# Patient Record
Sex: Male | Born: 1935 | Race: White | Hispanic: No | Marital: Married | Smoking: Never smoker
Health system: Southern US, Community
[De-identification: ages and names within clinical notes are randomized; demographics above are authoritative.]

## PROBLEM LIST (undated history)

## (undated) DIAGNOSIS — M542 Cervicalgia: Secondary | ICD-10-CM

## (undated) DIAGNOSIS — R269 Unspecified abnormalities of gait and mobility: Secondary | ICD-10-CM

## (undated) DIAGNOSIS — G629 Polyneuropathy, unspecified: Secondary | ICD-10-CM

## (undated) DIAGNOSIS — N400 Enlarged prostate without lower urinary tract symptoms: Secondary | ICD-10-CM

## (undated) DIAGNOSIS — G609 Hereditary and idiopathic neuropathy, unspecified: Secondary | ICD-10-CM

## (undated) DIAGNOSIS — M4800 Spinal stenosis, site unspecified: Secondary | ICD-10-CM

## (undated) DIAGNOSIS — M21372 Foot drop, left foot: Secondary | ICD-10-CM

## (undated) DIAGNOSIS — M21371 Foot drop, right foot: Secondary | ICD-10-CM

## (undated) DIAGNOSIS — E785 Hyperlipidemia, unspecified: Secondary | ICD-10-CM

## (undated) DIAGNOSIS — I10 Essential (primary) hypertension: Secondary | ICD-10-CM

## (undated) DIAGNOSIS — C61 Malignant neoplasm of prostate: Secondary | ICD-10-CM

## (undated) DIAGNOSIS — R251 Tremor, unspecified: Secondary | ICD-10-CM

## (undated) DIAGNOSIS — R339 Retention of urine, unspecified: Secondary | ICD-10-CM

## (undated) DIAGNOSIS — M5416 Radiculopathy, lumbar region: Secondary | ICD-10-CM

## (undated) HISTORY — PX: CERVICAL SPINE SURGERY: SHX589

## (undated) HISTORY — PX: KNEE ARTHROSCOPY: SHX127

## (undated) HISTORY — DX: Foot drop, right foot: M21.371

## (undated) HISTORY — DX: Unspecified abnormalities of gait and mobility: R26.9

## (undated) HISTORY — DX: Radiculopathy, lumbar region: M54.16

## (undated) HISTORY — PX: VASECTOMY: SHX75

## (undated) HISTORY — PX: NASAL SINUS SURGERY: SHX719

## (undated) HISTORY — PX: VASOTOMY: SUR1432

## (undated) HISTORY — DX: Hereditary and idiopathic neuropathy, unspecified: G60.9

## (undated) HISTORY — DX: Foot drop, left foot: M21.372

## (undated) HISTORY — DX: Tremor, unspecified: R25.1

---

## 2013-01-25 ENCOUNTER — Emergency Department (HOSPITAL_BASED_OUTPATIENT_CLINIC_OR_DEPARTMENT_OTHER): Payer: Medicare Other

## 2013-01-25 ENCOUNTER — Encounter (HOSPITAL_BASED_OUTPATIENT_CLINIC_OR_DEPARTMENT_OTHER): Payer: Self-pay | Admitting: Emergency Medicine

## 2013-01-25 ENCOUNTER — Emergency Department (HOSPITAL_BASED_OUTPATIENT_CLINIC_OR_DEPARTMENT_OTHER)
Admission: EM | Admit: 2013-01-25 | Discharge: 2013-01-26 | Disposition: A | Discharge status: Home / Self Care | Payer: Medicare Other | Attending: Emergency Medicine | Admitting: Emergency Medicine

## 2013-01-25 DIAGNOSIS — Z862 Personal history of diseases of the blood and blood-forming organs and certain disorders involving the immune mechanism: Secondary | ICD-10-CM | POA: Insufficient documentation

## 2013-01-25 DIAGNOSIS — F411 Generalized anxiety disorder: Secondary | ICD-10-CM | POA: Insufficient documentation

## 2013-01-25 DIAGNOSIS — Z87448 Personal history of other diseases of urinary system: Secondary | ICD-10-CM | POA: Insufficient documentation

## 2013-01-25 DIAGNOSIS — I10 Essential (primary) hypertension: Secondary | ICD-10-CM | POA: Insufficient documentation

## 2013-01-25 DIAGNOSIS — Z8639 Personal history of other endocrine, nutritional and metabolic disease: Secondary | ICD-10-CM | POA: Insufficient documentation

## 2013-01-25 DIAGNOSIS — F419 Anxiety disorder, unspecified: Secondary | ICD-10-CM

## 2013-01-25 DIAGNOSIS — R61 Generalized hyperhidrosis: Secondary | ICD-10-CM | POA: Insufficient documentation

## 2013-01-25 DIAGNOSIS — K219 Gastro-esophageal reflux disease without esophagitis: Secondary | ICD-10-CM | POA: Insufficient documentation

## 2013-01-25 DIAGNOSIS — K5900 Constipation, unspecified: Secondary | ICD-10-CM | POA: Insufficient documentation

## 2013-01-25 HISTORY — DX: Benign prostatic hyperplasia without lower urinary tract symptoms: N40.0

## 2013-01-25 HISTORY — DX: Retention of urine, unspecified: R33.9

## 2013-01-25 HISTORY — DX: Cervicalgia: M54.2

## 2013-01-25 HISTORY — DX: Hyperlipidemia, unspecified: E78.5

## 2013-01-25 HISTORY — DX: Essential (primary) hypertension: I10

## 2013-01-25 LAB — CBC WITH DIFFERENTIAL/PLATELET
Basophils Absolute: 0 10*3/uL (ref 0.0–0.1)
Basophils Relative: 0 % (ref 0–1)
Eosinophils Absolute: 0.1 10*3/uL (ref 0.0–0.7)
Eosinophils Relative: 1 % (ref 0–5)
Hemoglobin: 13.5 g/dL (ref 13.0–17.0)
Lymphocytes Relative: 16 % (ref 12–46)
MCHC: 33.3 g/dL (ref 30.0–36.0)
MCV: 87.7 fL (ref 78.0–100.0)
Monocytes Relative: 14 % — ABNORMAL HIGH (ref 3–12)
Neutro Abs: 4.5 10*3/uL (ref 1.7–7.7)
Platelets: 204 10*3/uL (ref 150–400)
RDW: 13.7 % (ref 11.5–15.5)
WBC: 6.5 10*3/uL (ref 4.0–10.5)

## 2013-01-25 MED ORDER — LORAZEPAM 1 MG PO TABS
0.5000 mg | ORAL_TABLET | Freq: Once | ORAL | Status: AC
  Administered 2013-01-25: 0.5 mg via ORAL
  Filled 2013-01-25: qty 1

## 2013-01-25 MED ORDER — GI COCKTAIL ~~LOC~~
30.0000 mL | Freq: Once | ORAL | Status: AC
  Administered 2013-01-25: 30 mL via ORAL
  Filled 2013-01-25: qty 30

## 2013-01-25 NOTE — ED Provider Notes (Signed)
CSN: [id number]     Arrival date & time 01/25/13  2123 History  This chart was scribed for Ken Bonn Smitty Cords, MD by Ronal Fear, ED Scribe. This patient was seen in room MH08/MH08 and the patient's care was started at 11:02 PM.     Chief Complaint  Patient presents with  . Anxiety    lightheadness   Patient is a 77 y.o. male presenting with anxiety. The history is provided by the patient. No language interpreter was used.  Anxiety This is a new problem. The current episode started more than 2 days ago. The problem occurs constantly. The problem has not changed since onset.Pertinent negatives include no chest pain, no headaches and no shortness of breath. Nothing aggravates the symptoms. Nothing relieves the symptoms. He has tried nothing for the symptoms. The treatment provided no relief.    HPI Comments: Jaxtin Raimondo is a 77 y.o. male who presents to the Emergency Department complaining of abdominal pain below the umbilicus with associated constipation, diaphoresis and anxiety. 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 Pt denies CP and SOB, fever, cough or sore throat.  No weakness nor numbness.  Has taken the meds on an empty stomach.      Past Medical History  Diagnosis Date  . Hypertension   . Hyperlipidemia   . Urinary retention   . Prostate enlargement   . Neck pain    Past Surgical History  Procedure Laterality Date  . Knee arthroscopy    . Vasotomy     History reviewed. No pertinent family history. History  Substance Use Topics  . Smoking status: Never Smoker   . Smokeless tobacco: Not on file  . Alcohol Use: Yes    Review of Systems  Constitutional: Negative for fever.  HENT: Negative for neck stiffness.   Eyes: Negative for photophobia.  Respiratory: Negative for cough, chest tightness, shortness  of breath and wheezing.   Cardiovascular: Negative for chest pain, palpitations and leg swelling.  Gastrointestinal: Negative for nausea and vomiting.  Skin: Negative for rash.  Neurological: Negative for headaches.  Psychiatric/Behavioral: Negative for suicidal ideas and hallucinations. The patient is nervous/anxious.   All other systems reviewed and are negative.    Allergies  Review of patient's allergies indicates no known allergies.  Home Medications  No current outpatient prescriptions on file. BP 157/90  Pulse 62  Temp(Src) 98.4 F (36.9 C)  Ht 5\' 10"  (1.778 m)  Wt 241 lb (109.317 kg)  BMI 34.58 kg/m2  SpO2 98% Physical Exam  Nursing note and vitals reviewed. Constitutional: He is oriented to person, place, and time. He appears well-developed and well-nourished. No distress.  HENT:  Head: Normocephalic and atraumatic.  Mouth/Throat: Oropharynx is clear and moist. No oropharyngeal exudate.  Eyes: EOM are normal. Pupils are equal, round, and reactive to light.  Neck: Normal range of motion. Neck supple. No tracheal deviation present.  Cardiovascular: Normal rate, regular rhythm, normal heart sounds and intact distal pulses.   Pulmonary/Chest: Effort normal and breath sounds normal. No respiratory distress. He has no wheezes. He has no rales.  Abdominal: Soft. Bowel sounds are normal. He exhibits no distension. There is no tenderness. There is no rebound and no guarding.  Moving hiatal hernia. Hyper active bowel sounds  Musculoskeletal: Normal range of motion.  Lymphadenopathy:  He has no cervical adenopathy.  Neurological: He is alert and oriented to person, place, and time.  Skin: Skin is warm and dry.  Psychiatric: His mood appears anxious.    ED Course  Procedures (including critical care time)  DIAGNOSTIC STUDIES: Oxygen Saturation is 98% on RA, normal by my interpretation.    COORDINATION OF CARE: 11:15 PM- Pt advised of plan for treatment including blood  work  And X-ray and pt agrees.      Labs Review Labs Reviewed - No data to display Imaging Review No results found.  MDM  No diagnosis found.  Date: 01/26/2013  Rate: 68  Rhythm: normal sinus rhythm  QRS Axis: normal  Intervals: normal  ST/T Wave abnormalities: normal  Conduction Disutrbances: none  Narrative Interpretation: unremarkable  Anxiety and medication side effects along with constipation.   Given ongoing symptoms of > 8 hours a negative EKG and troponin excluded ACS.  No signs of ulcer perforation.  Recommend d/c of celebrex and will treat.  Follow up immediately with your PMD.  Patient feels improved and verbalizes understanding and agrees to follow up   I personally performed the services described in this documentation, which was scribed in my presence. The recorded information has been reviewed and is accurate.    Jasmine Awe, MD 01/26/13 [phone]

## 2013-01-25 NOTE — ED Notes (Addendum)
Pt reports feeling extremely nervous anxious and lightheaded recent stressors at work and in planing to get married soon EKG completed NSR 70's taken to EDP pt states recently started taking Cellabrex for neck pain

## 2013-01-25 NOTE — ED Notes (Addendum)
Denies any febrile illness, pain, chest pain.  Only c/o headache amd left lateral neck pain, chronic in nature.  Pt also reports having some episodes of diaphoresis/dizziness for the past few days intermittently.

## 2013-01-26 LAB — COMPREHENSIVE METABOLIC PANEL
ALT: 18 U/L (ref 0–53)
AST: 20 U/L (ref 0–37)
Albumin: 3.9 g/dL (ref 3.5–5.2)
CO2: 25 mEq/L (ref 19–32)
Calcium: 9.3 mg/dL (ref 8.4–10.5)
GFR calc non Af Amer: 82 mL/min — ABNORMAL LOW (ref >90)
Sodium: 138 mEq/L (ref 135–145)
Total Protein: 6.9 g/dL (ref 6.0–8.3)

## 2013-01-26 MED ORDER — LORAZEPAM 1 MG PO TABS: 0.5000 mg | ORAL_TABLET | Freq: Three times a day (TID) | ORAL | Status: DC | PRN

## 2013-01-26 MED ORDER — POLYETHYLENE GLYCOL 3350 17 GM/SCOOP PO POWD: 17.0000 g | Freq: Every day | ORAL | Status: DC

## 2013-01-26 MED ORDER — OMEPRAZOLE 20 MG PO CPDR: 20.0000 mg | DELAYED_RELEASE_CAPSULE | Freq: Every day | ORAL | Status: AC

## 2013-01-26 NOTE — ED Notes (Signed)
Pt resting quietly.  Family at bedside.  Visibly appears to feel better ,more relaxed at present.

## 2014-01-13 ENCOUNTER — Emergency Department (HOSPITAL_BASED_OUTPATIENT_CLINIC_OR_DEPARTMENT_OTHER)
Admission: EM | Admit: 2014-01-13 | Discharge: 2014-01-13 | Disposition: A | Discharge status: Home / Self Care | Payer: Medicare Other | Attending: Emergency Medicine | Admitting: Emergency Medicine

## 2014-01-13 ENCOUNTER — Other Ambulatory Visit: Payer: Self-pay | Admitting: Orthopaedic Surgery

## 2014-01-13 ENCOUNTER — Encounter (HOSPITAL_BASED_OUTPATIENT_CLINIC_OR_DEPARTMENT_OTHER): Payer: Self-pay | Admitting: Emergency Medicine

## 2014-01-13 DIAGNOSIS — I10 Essential (primary) hypertension: Secondary | ICD-10-CM | POA: Insufficient documentation

## 2014-01-13 DIAGNOSIS — Z8546 Personal history of malignant neoplasm of prostate: Secondary | ICD-10-CM | POA: Diagnosis not present

## 2014-01-13 DIAGNOSIS — M545 Low back pain, unspecified: Secondary | ICD-10-CM | POA: Insufficient documentation

## 2014-01-13 DIAGNOSIS — Z791 Long term (current) use of non-steroidal anti-inflammatories (NSAID): Secondary | ICD-10-CM | POA: Diagnosis not present

## 2014-01-13 DIAGNOSIS — R29898 Other symptoms and signs involving the musculoskeletal system: Secondary | ICD-10-CM

## 2014-01-13 DIAGNOSIS — M5442 Lumbago with sciatica, left side: Secondary | ICD-10-CM

## 2014-01-13 DIAGNOSIS — M6281 Muscle weakness (generalized): Secondary | ICD-10-CM | POA: Insufficient documentation

## 2014-01-13 DIAGNOSIS — Z87448 Personal history of other diseases of urinary system: Secondary | ICD-10-CM | POA: Diagnosis not present

## 2014-01-13 DIAGNOSIS — IMO0002 Reserved for concepts with insufficient information to code with codable children: Secondary | ICD-10-CM | POA: Insufficient documentation

## 2014-01-13 DIAGNOSIS — Z8669 Personal history of other diseases of the nervous system and sense organs: Secondary | ICD-10-CM | POA: Insufficient documentation

## 2014-01-13 DIAGNOSIS — E785 Hyperlipidemia, unspecified: Secondary | ICD-10-CM | POA: Insufficient documentation

## 2014-01-13 DIAGNOSIS — Z7982 Long term (current) use of aspirin: Secondary | ICD-10-CM | POA: Insufficient documentation

## 2014-01-13 DIAGNOSIS — Z79899 Other long term (current) drug therapy: Secondary | ICD-10-CM | POA: Insufficient documentation

## 2014-01-13 DIAGNOSIS — Z9889 Other specified postprocedural states: Secondary | ICD-10-CM | POA: Diagnosis not present

## 2014-01-13 HISTORY — DX: Malignant neoplasm of prostate: C61

## 2014-01-13 HISTORY — DX: Spinal stenosis, site unspecified: M48.00

## 2014-01-13 HISTORY — DX: Polyneuropathy, unspecified: G62.9

## 2014-01-13 MED ORDER — HYDROCODONE-ACETAMINOPHEN 5-325 MG PO TABS: 1.0000 | ORAL_TABLET | ORAL | Status: DC | PRN

## 2014-01-13 MED ORDER — HYDROCODONE-ACETAMINOPHEN 5-325 MG PO TABS
2.0000 | ORAL_TABLET | Freq: Once | ORAL | Status: AC
  Administered 2014-01-13: 2 via ORAL
  Filled 2014-01-13: qty 2

## 2014-01-13 NOTE — ED Provider Notes (Signed)
CSN: [id number]     Arrival date & time 01/13/14  1121 History   First MD Initiated Contact with Patient 01/13/14 1204     Chief Complaint  Patient presents with  . Back Pain     (Consider location/radiation/quality/duration/timing/severity/associated sxs/prior Treatment) HPI Comments: Patient is a 78 year old male with history of hypertension, peripheral neuropathy. He presents with complaints of pain in his lower back for the past 9 days. He states he lifted a heavy table and helped to carry it up the stairs. His pain began shortly thereafter. He is now having weakness in his left leg and is having difficulty ambulating. He was seen by neurosurgery and prescribed prednisone however is not improving. He denies any bowel or bladder complaints.  Patient is a 78 y.o. male presenting with back pain. The history is provided by the patient.  Back Pain Location:  Lumbar spine Quality:  Stabbing Radiates to:  R thigh Pain severity:  Moderate Pain is:  Same all the time Onset quality:  Sudden Duration:  9 days Timing:  Constant Progression:  Worsening Chronicity:  New Context: lifting heavy objects     Past Medical History  Diagnosis Date  . Hypertension   . Hyperlipidemia   . Urinary retention   . Prostate enlargement   . Neck pain   . Prostate cancer     currently has seed implant  . Spinal stenosis   . Peripheral neuropathy     bilateral feet   Past Surgical History  Procedure Laterality Date  . Knee arthroscopy    . Vasotomy    . Nasal sinus surgery    . Vasectomy     No family history on file. History  Substance Use Topics  . Smoking status: Never Smoker   . Smokeless tobacco: Not on file  . Alcohol Use: Yes     Comment: occassionally    Review of Systems  Musculoskeletal: Positive for back pain.  All other systems reviewed and are negative.     Allergies  Review of patient's allergies indicates no known allergies.  Home Medications   Prior to Admission  medications   Medication Sig Start Date End Date Taking? Authorizing Provider  Ascorbic Acid (VITAMIN C) 1000 MG tablet Take 1,000 mg by mouth daily.   Yes Historical Provider, MD  aspirin 81 MG tablet Take 81 mg by mouth daily.   Yes Historical Provider, MD  Azelaic Acid (FINACEA) 15 % cream Apply topically 2 (two) times daily. After skin is thoroughly washed and patted dry, gently but thoroughly massage a thin film of azelaic acid cream into the affected area twice daily, in the morning and evening.   Yes Historical Provider, MD  baclofen (LIORESAL) 10 MG tablet Take 10 mg by mouth 3 (three) times daily.   Yes Historical Provider, MD  clonazePAM (KLONOPIN) 1 MG tablet Take 1 mg by mouth 2 (two) times daily as needed for anxiety.   Yes Historical Provider, MD  Cyanocobalamin (VITAMIN B 12 PO) Take 1,000 mg by mouth.   Yes Historical Provider, MD  diphenhydramine-acetaminophen (TYLENOL PM) 25-500 MG TABS Take 2 tablets by mouth at bedtime as needed.   Yes Historical Provider, MD  enalapril (VASOTEC) 20 MG tablet Take 20 mg by mouth daily.   Yes Historical Provider, MD  finasteride (PROSCAR) 5 MG tablet Take 5 mg by mouth daily.   Yes Historical Provider, MD  fluticasone (FLONASE) 50 MCG/ACT nasal spray Place into both nostrils daily.   Yes Historical Provider,  MD  meloxicam (MOBIC) 7.5 MG tablet Take 7.5 mg by mouth daily.   Yes Historical Provider, MD  Multiple Vitamin (MULTIVITAMIN) capsule Take 1 capsule by mouth daily.   Yes Historical Provider, MD  Omega-3 Fatty Acids (FISH OIL PO) Take by mouth.   Yes Historical Provider, MD  primidone (MYSOLINE) 250 MG tablet Take 250 mg by mouth 4 (four) times daily.   Yes Historical Provider, MD  sildenafil (VIAGRA) 100 MG tablet Take 100 mg by mouth daily as needed for erectile dysfunction.   Yes Historical Provider, MD  simvastatin (ZOCOR) 20 MG tablet Take 20 mg by mouth daily.   Yes Historical Provider, MD  tamsulosin (FLOMAX) 0.4 MG CAPS capsule Take  0.4 mg by mouth.   Yes Historical Provider, MD  traMADol (ULTRAM-ER) 100 MG 24 hr tablet Take 100 mg by mouth daily.   Yes Historical Provider, MD  LORazepam (ATIVAN) 1 MG tablet Take 0.5 tablets (0.5 mg total) by mouth every 8 (eight) hours as needed for anxiety. 01/26/13   April K Palumbo-Rasch, MD  omeprazole (PRILOSEC) 20 MG capsule Take 1 capsule (20 mg total) by mouth daily. 01/26/13   April K Palumbo-Rasch, MD  polyethylene glycol powder (MIRALAX) powder Take 17 g by mouth daily. 01/26/13   April K Palumbo-Rasch, MD   BP 104/81  Pulse 70  Temp(Src) 97.5 F (36.4 C) (Oral)  Resp 16  Ht 5\' 9"  (1.753 m)  Wt 242 lb (109.77 kg)  BMI 35.72 kg/m2  SpO2 100% Physical Exam  Nursing note and vitals reviewed. Constitutional: He is oriented to person, place, and time. He appears well-developed and well-nourished. No distress.  HENT:  Head: Normocephalic and atraumatic.  Neck: Normal range of motion. Neck supple.  Cardiovascular: Normal rate, regular rhythm and normal heart sounds.   No murmur heard. Pulmonary/Chest: Effort normal and breath sounds normal. No respiratory distress. He has no wheezes.  Abdominal: Soft. Bowel sounds are normal. He exhibits no distension. There is no tenderness.  Musculoskeletal: Normal range of motion. He exhibits no edema.  There is tenderness to palpation in the soft tissues of the right lumbar paraspinal musculature. There is no bony tenderness.  Neurological: He is alert and oriented to person, place, and time.  Strength is 5 out of 5 in the bilateral lower extremities. DTRs are trace and symmetrical. He is able to stand, however when he ambulates his left leg does not support his weight.  Skin: Skin is warm and dry. He is not diaphoretic.    ED Course  Procedures (including critical care time) Labs Review Labs Reviewed - No data to display  Imaging Review No results found.   EKG Interpretation None      MDM   Final diagnoses:  None     Patient is a 78 year old male who presents with a one-week history of low back pain. He is now having weakness in his left leg and is having difficulty walking. He was seen by his neurosurgeon several days ago and prescribed prednisone however is not getting better. On exam he has difficulty ambulating and holding his own weight due to weakness in his left leg. I feel as though he requires an MRI to further evaluate this condition. While the family was in the exam room, they did hear from their neurosurgeon who has made arrangements for an MRI today at 4:30. The patient prefers to have the test done that was scheduled by his neurosurgeon as an outpatient as opposed to being transferred  to [facility] as I had offered. I will provide pain medication which he can take in the meantime.    Veryl Speak, MD 01/13/14 [phone]

## 2014-01-13 NOTE — ED Notes (Signed)
Patient states he lifted a table and carried it down step nine days ago.  States the next day he had pain in his lower back, and has since developed numbness down his left leg.  Was seen by his PCP seven days ago and given a dose pack of steroids.  States he continues to have pain and weakness in his left leg, with pain in the left knee.  Fell yesterday due to his left leg giving out and now has pain in his left shoulder.  History of spinal stenosis, which is associated with peripheral numbness in his feet.

## 2014-01-13 NOTE — Discharge Instructions (Signed)
Back Pain, Adult °Back pain is very common. The pain often gets better over time. The cause of back pain is usually not dangerous. Most people can learn to manage their back pain on their own.  °HOME CARE  °· Stay active. Start with short walks on flat ground if you can. Try to walk farther each day. °· Do not sit, drive, or stand in one place for more than 30 minutes. Do not stay in bed. °· Do not avoid exercise or work. Activity can help your back heal faster. °· Be careful when you bend or lift an object. Bend at your knees, keep the object close to you, and do not twist. °· Sleep on a firm mattress. Lie on your side, and bend your knees. If you lie on your back, put a pillow under your knees. °· Only take medicines as told by your doctor. °· Put ice on the injured area. °¨ Put ice in a plastic bag. °¨ Place a towel between your skin and the bag. °¨ Leave the ice on for 15-20 minutes, 03-04 times a day for the first 2 to 3 days. After that, you can switch between ice and heat packs. °· Ask your doctor about back exercises or massage. °· Avoid feeling anxious or stressed. Find good ways to deal with stress, such as exercise. °GET HELP RIGHT AWAY IF:  °· Your pain does not go away with rest or medicine. °· Your pain does not go away in 1 week. °· You have new problems. °· You do not feel well. °· The pain spreads into your legs. °· You cannot control when you poop (bowel movement) or pee (urinate). °· Your arms or legs feel weak or lose feeling (numbness). °· You feel sick to your stomach (nauseous) or throw up (vomit). °· You have belly (abdominal) pain. °· You feel like you may pass out (faint). °MAKE SURE YOU:  °· Understand these instructions. °· Will watch your condition. °· Will get help right away if you are not doing well or get worse. °Document Released: 10/11/2007 Document Revised: 07/17/2011 Document Reviewed: 08/26/2013 °[employer]® Patient Information ©2015 [employer]. This information is not intended  to replace advice given to you by your health care provider. Make sure you discuss any questions you have with your health care provider. ° °

## 2014-01-15 ENCOUNTER — Inpatient Hospital Stay: Admission: RE | Admit: 2014-01-15 | Payer: Medicare Other | Source: Ambulatory Visit

## 2014-03-05 ENCOUNTER — Ambulatory Visit (INDEPENDENT_AMBULATORY_CARE_PROVIDER_SITE_OTHER): Payer: Medicare Other | Admitting: Neurology

## 2014-03-05 ENCOUNTER — Encounter: Payer: Self-pay | Admitting: Neurology

## 2014-03-05 ENCOUNTER — Ambulatory Visit (INDEPENDENT_AMBULATORY_CARE_PROVIDER_SITE_OTHER): Payer: Self-pay

## 2014-03-05 DIAGNOSIS — Z0289 Encounter for other administrative examinations: Secondary | ICD-10-CM

## 2014-03-05 DIAGNOSIS — G629 Polyneuropathy, unspecified: Secondary | ICD-10-CM | POA: Insufficient documentation

## 2014-03-05 DIAGNOSIS — G609 Hereditary and idiopathic neuropathy, unspecified: Secondary | ICD-10-CM

## 2014-03-05 DIAGNOSIS — G5601 Carpal tunnel syndrome, right upper limb: Secondary | ICD-10-CM

## 2014-03-05 DIAGNOSIS — M5416 Radiculopathy, lumbar region: Secondary | ICD-10-CM

## 2014-03-05 DIAGNOSIS — M21371 Foot drop, right foot: Secondary | ICD-10-CM

## 2014-03-05 HISTORY — DX: Hereditary and idiopathic neuropathy, unspecified: G60.9

## 2014-03-05 HISTORY — DX: Radiculopathy, lumbar region: M54.16

## 2014-03-05 NOTE — Procedures (Signed)
     HISTORY:  Juan Combs is a 78 year old gentleman with a two-year history of a right foot drop, recently developing a left foot drop as well, with numbness in the feet and legs. The patient has a history of back pain, and he is being evaluated for a possible lumbosacral radiculopathy or a peripheral neuropathy.  NERVE CONDUCTION STUDIES:  Nerve conduction studies were performed on the right upper extremity. The distal motor latency for the right median nerve was prolonged, with a low motor amplitude. The distal motor latency and motor amplitude for the right ulnar nerve was normal. The F wave latency for the right median nerve was prolonged, normal for the right ulnar nerve. The nerve conduction velocities for the right median and ulnar nerves were normal. The sensory latency for the right median nerve was prolonged, normal for the right ulnar nerve.  Nerve conduction studies were performed on both lower extremities. No response was seen for the peroneal and posterior tibial nerves bilaterally. The peroneal sensory latencies were absent bilaterally. The H reflex latencies were absent bilaterally.  EMG STUDIES:  EMG study was performed on the right lower extremity:  The tibialis anterior muscle reveals 2 to 5K motor units with markedly reduced recruitment. 2+ fibrillations and positive waves were seen. Complex repetitive discharges were seen. The peroneus tertius muscle reveals no voluntary motor units with no recruitment. No fibrillations or positive waves were seen. The medial gastrocnemius muscle reveals 2 to 5K motor units with markedly reduced recruitment. 2+ fibrillations and positive waves were seen. The vastus lateralis muscle reveals 2 to 4K motor units with decreased recruitment. No fibrillations or positive waves were seen. The iliopsoas muscle reveals 2 to 5K motor units with slightly decreased recruitment. No fibrillations or positive waves were seen. The biceps femoris muscle  (long head) reveals 2 to 6K motor units with decreased recruitment. No fibrillations or positive waves were seen. The lumbosacral paraspinal muscles were tested at 3 levels, and revealed no abnormalities of insertional activity at all 3 levels tested. There was good relaxation.   IMPRESSION:  Nerve conduction studies done on the right upper extremity and both lower extremities shows evidence of a severe, end-stage primarily axonal peripheral neuropathy. There is evidence of a right carpal tunnel syndrome of moderate severity. EMG evaluation of the right lower extremity shows severe acute and chronic denervation distally consistent with the diagnosis of peripheral neuropathy. There appears to be evidence of a mild chronic overlying L5 radiculopathy as well.  Jill Alexanders MD 03/05/2014 11:01 AM  [facility] [address] [place], [place] [address]  Phone [phone] Fax [phone]

## 2014-03-12 ENCOUNTER — Telehealth: Payer: Self-pay | Admitting: Neurology

## 2014-03-12 ENCOUNTER — Encounter: Payer: Self-pay | Admitting: Neurology

## 2014-03-12 ENCOUNTER — Ambulatory Visit (INDEPENDENT_AMBULATORY_CARE_PROVIDER_SITE_OTHER): Payer: Medicare Other | Admitting: Neurology

## 2014-03-12 VITALS — BP 113/61 | HR 65 | Ht 69.0 in | Wt 245.0 lb

## 2014-03-12 DIAGNOSIS — M21372 Foot drop, left foot: Secondary | ICD-10-CM

## 2014-03-12 DIAGNOSIS — G609 Hereditary and idiopathic neuropathy, unspecified: Secondary | ICD-10-CM

## 2014-03-12 DIAGNOSIS — E538 Deficiency of other specified B group vitamins: Secondary | ICD-10-CM

## 2014-03-12 DIAGNOSIS — R269 Unspecified abnormalities of gait and mobility: Secondary | ICD-10-CM

## 2014-03-12 HISTORY — DX: Unspecified abnormalities of gait and mobility: R26.9

## 2014-03-12 NOTE — Patient Instructions (Signed)

## 2014-03-12 NOTE — Telephone Encounter (Signed)
I called patient. We have discussed the eighth oh brace, I was holding off initially as the footdrop on the left is not as bad as it is on the right. I was hoping that strengthening exercises may help him. If he wants the brace, we can get this done, I'll write a prescription.

## 2014-03-12 NOTE — Progress Notes (Signed)
Reason for visit: peripheral neuropathy  Juan Combs is a 78 y.o. male  History of present illness:  Juan Combs is a 78 year old right-handed white male with a history of progressive weakness and numbness in the lower extremities that began approximately 2 years ago. The patient has had some balance issues associated with falls. The patient has bilateral foot drops that are worse on the right than the left. He now has an AFO brace on the right which has been quite helpful. The patient denies any numbness or weakness in the hands, and he senses some numbness up to just below the knees bilaterally. He denies any pain per se in the legs. The patient reports an itching sensation on the left occipital area of the head associated with a mild skin rash. The patient denies issues controlling the bowels or the bladder, but he does have some urinary urgency associated with his history of prostate cancer. He does have some left-sided neck pain, he denies any severe low back pain. He has an occasional left occipital headache. The patient has undergone EMG nerve conduction studies that show evidence of a severe neuropathy affecting the lower extremities that is primarily axonal in nature. He comes to this office for further evaluation.  Past Medical History  Diagnosis Date  . Hypertension   . Hyperlipidemia   . Urinary retention   . Prostate enlargement   . Neck pain   . Prostate cancer     currently has seed implant  . Spinal stenosis   . Peripheral neuropathy     bilateral feet  . Hereditary and idiopathic peripheral neuropathy 03/05/2014  . Lumbar radiculopathy 03/05/2014  . Tremor   . Gait disorder 03/12/2014    Past Surgical History  Procedure Laterality Date  . Knee arthroscopy    . Vasotomy    . Nasal sinus surgery    . Vasectomy      Family History  Problem Relation Age of Onset  . Emphysema Father   . Scoliosis Sister     Social history:  reports that he has never smoked. He  has never used smokeless tobacco. He reports that he drinks alcohol. He reports that he does not use illicit drugs.  Medications:  Current Outpatient Prescriptions on File Prior to Visit  Medication Sig Dispense Refill  . Ascorbic Acid (VITAMIN C) 1000 MG tablet Take 1,000 mg by mouth daily.    Marland Kitchen aspirin 81 MG tablet Take 81 mg by mouth daily.    . Azelaic Acid (FINACEA) 15 % cream Apply topically 2 (two) times daily. After skin is thoroughly washed and patted dry, gently but thoroughly massage a thin film of azelaic acid cream into the affected area twice daily, in the morning and evening.    . baclofen (LIORESAL) 10 MG tablet Take 10 mg by mouth 3 (three) times daily.    . clonazePAM (KLONOPIN) 1 MG tablet Take 1 mg by mouth 2 (two) times daily as needed for anxiety.    . Cyanocobalamin (VITAMIN B 12 PO) Take 1,000 mg by mouth.    . diphenhydramine-acetaminophen (TYLENOL PM) 25-500 MG TABS Take 2 tablets by mouth at bedtime as needed.    . enalapril (VASOTEC) 20 MG tablet Take 20 mg by mouth daily.    . finasteride (PROSCAR) 5 MG tablet Take 5 mg by mouth daily.    . fluticasone (FLONASE) 50 MCG/ACT nasal spray Place into both nostrils daily.    Marland Kitchen HYDROcodone-acetaminophen (NORCO) 5-325 MG per  tablet Take 1-2 tablets by mouth every 4 (four) hours as needed. 20 tablet 0  . meloxicam (MOBIC) 7.5 MG tablet Take 7.5 mg by mouth daily.    . Multiple Vitamin (MULTIVITAMIN) capsule Take 1 capsule by mouth daily.    . Omega-3 Fatty Acids (FISH OIL PO) Take by mouth.    Marland Kitchen omeprazole (PRILOSEC) 20 MG capsule Take 1 capsule (20 mg total) by mouth daily. 30 capsule 0  . polyethylene glycol powder (MIRALAX) powder Take 17 g by mouth daily. 255 g 0  . primidone (MYSOLINE) 250 MG tablet Take 250 mg by mouth 4 (four) times daily.    . sildenafil (VIAGRA) 100 MG tablet Take 100 mg by mouth daily as needed for erectile dysfunction.    . simvastatin (ZOCOR) 20 MG tablet Take 20 mg by mouth daily.    .  tamsulosin (FLOMAX) 0.4 MG CAPS capsule Take 0.4 mg by mouth.    . traMADol (ULTRAM-ER) 100 MG 24 hr tablet Take 100 mg by mouth daily.     No current facility-administered medications on file prior to visit.     No Known Allergies  ROS:  Out of a complete 14 system review of symptoms, the patient complains only of the following symptoms, and all other reviewed systems are negative.  Constipation Insomnia Frequency of urination Joint pain, walking difficulty Itching of the head Headache, numbness, tremors  Blood pressure 113/61, pulse 65, height 5\' 9"  (1.753 m), weight 245 lb (111.131 kg).  Physical Exam  General: The patient is alert and cooperative at the time of the examination. The patient is moderately obese.  Eyes: Pupils are equal, round, and reactive to light. Discs are flat bilaterally.  Neck: The neck is supple, no carotid bruits are noted.  Respiratory: The respiratory examination is clear.  Cardiovascular: The cardiovascular examination reveals a regular rate and rhythm, no obvious murmurs or rubs are noted.  Skin: Extremities are with 1+ edema at the ankles bilaterally.  Neurologic Exam  Mental status: The patient is alert and oriented x 3 at the time of the examination. The patient has apparent normal recent and remote memory, with an apparently normal attention span and concentration ability.  Cranial nerves: Facial symmetry is present. There is good sensation of the face to pinprick and soft touch bilaterally. The strength of the facial muscles and the muscles to head turning and shoulder shrug are normal bilaterally. Speech is well enunciated, no aphasia or dysarthria is noted. Extraocular movements are full. Visual fields are full. The tongue is midline, and the patient has symmetric elevation of the soft palate. No obvious hearing deficits are noted.  Motor: The motor testing reveals 5 over 5 strength of all 4 extremities, with exception of bilateral foot  drops, right greater than left, and some eversion weakness of the feet bilaterally. Good symmetric motor tone is noted throughout.  Sensory: Sensory testing is intact to pinprick, soft touch, vibration sensation, and position sense on the upper extremities. With the lower extremities, the patient has a stocking pattern pinprick sensory deficit up to the knees bilaterally. A moderate impairment of vibration sensation is seen, with a mild impairment of position sense bilaterally in the feet. No evidence of extinction is noted.  Coordination: Cerebellar testing reveals good finger-nose-finger and heel-to-shin bilaterally.  Gait and station: Gait is slightly wide-based. The patient normally uses a cane for ambulation. Tandem gait is unsteady. Romberg is negative. No drift is seen. The patient is able to walk on the  toes bilaterally, unable to walk on heels on either side.  Reflexes: Deep tendon reflexes are symmetric, but are depressed bilaterally. Toes are downgoing bilaterally.   Assessment/Plan:  1. Severe peripheral neuropathy, axonal  2. Benign essential tremor  3. Gait disorder  The patient will undergo further blood work looking for various etiologies of the peripheral neuropathy. The patient is not having a lot of discomfort with the neuropathy, and he will not require medications for this issue. In the future, he may require an AFO brace on the left as well, but the footdrop on this side is not as severe. The patient may benefit from strengthening exercises and exercises that work on balance. He will follow up in 4 months.  Jill Alexanders MD 03/12/2014 12:59 PM  [facility] [address] [place], [place] [address]  Phone [phone] Fax [phone]

## 2014-03-12 NOTE — Telephone Encounter (Signed)
Patient wants to know if a brace for his left foot has been ordered? This was discussed with Dr. Jannifer Franklin but he has not heard anything. [employer] [facility] [place] call back number [phone].

## 2014-03-12 NOTE — Telephone Encounter (Signed)
I don't see that a brace for his left foot has been ordered.  It was documented in his office note In the future, "he may require an AFO brace on the left as well, but the footdrop on this side is not as severe. "

## 2014-03-16 LAB — RHEUMATOID FACTOR: Rhuematoid fact SerPl-aCnc: 8.4 IU/mL (ref 0.0–13.9)

## 2014-03-16 LAB — IFE AND PE, SERUM
ALBUMIN SERPL ELPH-MCNC: 3.6 g/dL (ref 3.2–5.6)
ALPHA2 GLOB SERPL ELPH-MCNC: 0.8 g/dL (ref 0.4–1.2)
Albumin/Glob SerPl: 1.4 (ref 0.7–2.0)
Alpha 1: 0.2 g/dL (ref 0.1–0.4)
B-GLOBULIN SERPL ELPH-MCNC: 0.9 g/dL (ref 0.6–1.3)
Gamma Glob SerPl Elph-Mcnc: 0.7 g/dL (ref 0.5–1.6)
Globulin, Total: 2.6 g/dL (ref 2.0–4.5)
IgA/Immunoglobulin A, Serum: 274 mg/dL (ref 91–414)
IgG (Immunoglobin G), Serum: 727 mg/dL (ref 700–1600)
IgM (Immunoglobulin M), Srm: 16 mg/dL — ABNORMAL LOW (ref 40–230)
TOTAL PROTEIN: 6.2 g/dL (ref 6.0–8.5)

## 2014-03-16 LAB — LYME, TOTAL AB TEST/REFLEX: Lyme IgG/IgM Ab: <0.91 {ISR} (ref 0.00–0.90)

## 2014-03-16 LAB — COPPER, SERUM: COPPER: 105 ug/dL (ref 72–166)

## 2014-03-16 LAB — ANGIOTENSIN CONVERTING ENZYME: ANGIO CONVERT ENZYME: 21 U/L (ref 14–82)

## 2014-03-16 LAB — ANA W/REFLEX: Anti Nuclear Antibody(ANA): NEGATIVE

## 2014-03-17 ENCOUNTER — Telehealth: Payer: Self-pay

## 2014-03-17 NOTE — Telephone Encounter (Signed)
We will send the patient the blood work and the prescription.

## 2014-03-17 NOTE — Telephone Encounter (Signed)
Patient requesting lab results and Rx for L foot drop mailed to his address.

## 2014-03-17 NOTE — Telephone Encounter (Signed)
Mailed prescription and labs, per Dr. Jannifer Franklin.

## 2014-03-25 ENCOUNTER — Encounter: Payer: Self-pay | Admitting: Neurology

## 2014-03-31 ENCOUNTER — Encounter: Payer: Self-pay | Admitting: Neurology

## 2014-07-28 ENCOUNTER — Encounter: Payer: Self-pay | Admitting: Neurology

## 2014-07-28 ENCOUNTER — Ambulatory Visit (INDEPENDENT_AMBULATORY_CARE_PROVIDER_SITE_OTHER): Payer: Medicare PPO | Admitting: Neurology

## 2014-07-28 VITALS — BP 137/78 | HR 56 | Ht 70.0 in | Wt 250.6 lb

## 2014-07-28 DIAGNOSIS — R251 Tremor, unspecified: Secondary | ICD-10-CM | POA: Diagnosis not present

## 2014-07-28 DIAGNOSIS — R269 Unspecified abnormalities of gait and mobility: Secondary | ICD-10-CM | POA: Diagnosis not present

## 2014-07-28 DIAGNOSIS — M21372 Foot drop, left foot: Secondary | ICD-10-CM | POA: Diagnosis not present

## 2014-07-28 DIAGNOSIS — G609 Hereditary and idiopathic neuropathy, unspecified: Secondary | ICD-10-CM

## 2014-07-28 DIAGNOSIS — M21371 Foot drop, right foot: Secondary | ICD-10-CM | POA: Diagnosis not present

## 2014-07-28 HISTORY — DX: Foot drop, right foot: M21.371

## 2014-07-28 MED ORDER — PRIMIDONE 250 MG PO TABS: 250.0000 mg | ORAL_TABLET | Freq: Two times a day (BID) | ORAL | Status: DC

## 2014-07-28 NOTE — Progress Notes (Addendum)
Reason for visit: Peripheral neuropathy  Juan Combs is an 79 y.o. male  History of present illness:  Juan Combs is a 79 year old right-handed white male with a history of a peripheral neuropathy associated with bilateral foot drops. The patient has bilateral AFO braces on, which has helped his gait stability. He is staying quite active, he has joined [employer], and he works out 3 times a week. The patient is engaging in strengthening exercises. He plays golf, which works on his balance. The patient denies any falls since last seen. He is not having a lot of pain with his peripheral neuropathy. He returns to this office for further evaluation. He has an essential tremor, he is followed by Dr. Harrie Foreman for this, but he wishes to transfer care to this office. He takes Mysoline taking 250 mg twice daily. The tremor does impact his handwriting, and his ability to feed himself, but he denies the has to give up any activities because of the tremor.  Past Medical History  Diagnosis Date  . Hypertension   . Hyperlipidemia   . Urinary retention   . Prostate enlargement   . Neck pain   . Prostate cancer     currently has seed implant  . Spinal stenosis   . Peripheral neuropathy     bilateral feet  . Hereditary and idiopathic peripheral neuropathy 03/05/2014  . Lumbar radiculopathy 03/05/2014  . Tremor   . Gait disorder 03/12/2014  . Foot drop, bilateral 07/28/2014    Past Surgical History  Procedure Laterality Date  . Knee arthroscopy    . Vasotomy    . Nasal sinus surgery    . Vasectomy      Family History  Problem Relation Age of Onset  . Emphysema Father   . Scoliosis Sister     Social history:  reports that he has never smoked. He has never used smokeless tobacco. He reports that he drinks alcohol. He reports that he does not use illicit drugs.   No Known Allergies  Medications:  Prior to Admission medications   Medication Sig Start Date End Date Taking? Authorizing  Provider  Ascorbic Acid (VITAMIN C) 1000 MG tablet Take 1,000 mg by mouth daily.   Yes Historical Provider, MD  aspirin 81 MG tablet Take 81 mg by mouth daily.   Yes Historical Provider, MD  Azelaic Acid (FINACEA) 15 % cream Apply topically 2 (two) times daily. After skin is thoroughly washed and patted dry, gently but thoroughly massage a thin film of azelaic acid cream into the affected area twice daily, in the morning and evening.   Yes Historical Provider, MD  baclofen (LIORESAL) 10 MG tablet Take 10 mg by mouth 2 (two) times daily.    Yes Historical Provider, MD  clonazePAM (KLONOPIN) 1 MG tablet Take 1 mg by mouth 2 (two) times daily as needed for anxiety.   Yes Historical Provider, MD  Cyanocobalamin (VITAMIN B 12 PO) Take 1,000 mg by mouth.   Yes Historical Provider, MD  diphenhydramine-acetaminophen (TYLENOL PM) 25-500 MG TABS Take 2 tablets by mouth at bedtime as needed.   Yes Historical Provider, MD  enalapril (VASOTEC) 20 MG tablet Take 20 mg by mouth daily.   Yes Historical Provider, MD  finasteride (PROSCAR) 5 MG tablet Take 5 mg by mouth daily.   Yes Historical Provider, MD  fluticasone (FLONASE) 50 MCG/ACT nasal spray Place into both nostrils daily.   Yes Historical Provider, MD  gabapentin (NEURONTIN) 300 MG capsule  Take 300 mg by mouth 3 (three) times daily.   Yes Historical Provider, MD  HYDROcodone-acetaminophen (NORCO) 5-325 MG per tablet Take 1-2 tablets by mouth every 4 (four) hours as needed. 01/13/14  Yes Veryl Speak, MD  meloxicam (MOBIC) 7.5 MG tablet Take 7.5 mg by mouth daily.   Yes Historical Provider, MD  mometasone (ELOCON) 0.1 % cream Apply 1 application topically 2 (two) times daily.   Yes Historical Provider, MD  Multiple Vitamin (MULTIVITAMIN) capsule Take 1 capsule by mouth daily.   Yes Historical Provider, MD  Omega-3 Fatty Acids (FISH OIL PO) Take by mouth.   Yes Historical Provider, MD  omeprazole (PRILOSEC) 20 MG capsule Take 1 capsule (20 mg total) by mouth  daily. 01/26/13  Yes April Palumbo, MD  polyethylene glycol powder (MIRALAX) powder Take 17 g by mouth daily. 01/26/13  Yes April Palumbo, MD  primidone (MYSOLINE) 250 MG tablet Take 250 mg by mouth 2 (two) times daily.    Yes Historical Provider, MD  sildenafil (VIAGRA) 100 MG tablet Take 100 mg by mouth daily as needed for erectile dysfunction.   Yes Historical Provider, MD  simvastatin (ZOCOR) 20 MG tablet Take 20 mg by mouth daily.   Yes Historical Provider, MD  tamsulosin (FLOMAX) 0.4 MG CAPS capsule Take 0.4 mg by mouth.   Yes Historical Provider, MD  traMADol (ULTRAM) 50 MG tablet Take 50 mg by mouth every 6 (six) hours as needed. 07/13/14  Yes Historical Provider, MD    ROS:  Out of a complete 14 system review of symptoms, the patient complains only of the following symptoms, and all other reviewed systems are negative.  Runny nose Constipation Frequency of urination Joint pain Headache, tremors Itching  Blood pressure 137/78, pulse 56, height 5\' 10"  (1.778 m), weight 250 lb 9.6 oz (113.671 kg).  Physical Exam  General: The patient is alert and cooperative at the time of the examination. The patient is moderately obese.  Skin: No significant peripheral edema is noted. The patient is wearing bilateral AFO braces.   Neurologic Exam  Mental status: The patient is alert and oriented x 3 at the time of the examination. The patient has apparent normal recent and remote memory, with an apparently normal attention span and concentration ability.   Cranial nerves: Facial symmetry is present. Speech is normal, no aphasia or dysarthria is noted. Extraocular movements are full. Visual fields are full.  Motor: The patient has good strength in all 4 extremities, with exception of bilateral foot drops..  Sensory examination: Soft touch sensation is symmetric on the face, arms, and legs.  Coordination: The patient has good finger-nose-finger and heel-to-shin bilaterally.  Gait and  station: The patient has a slightly wide-based gait.. Tandem gait is unsteady. Romberg is negative. No drift is seen.  Reflexes: Deep tendon reflexes are symmetric, but are depressed.   Assessment/Plan:  1. Peripheral neuropathy  2. Gait instability  3. Benign essential tremor  The patient is doing quite well at this time with his balance, level of discomfort, and issues with the tremor. The patient will be given a prescription for the Mysoline to take 250 mg twice daily. The patient will continue with his exercises for leg strengthening. He will follow-up in 6-8 months.  Juan Alexanders MD 07/28/2014 7:48 PM  [facility] [address] [place], [place] [address]  Phone [phone] Fax [phone]

## 2014-07-28 NOTE — Patient Instructions (Signed)

## 2015-02-16 ENCOUNTER — Encounter: Payer: Self-pay | Admitting: Neurology

## 2015-02-16 ENCOUNTER — Ambulatory Visit (INDEPENDENT_AMBULATORY_CARE_PROVIDER_SITE_OTHER): Payer: Medicare PPO | Admitting: Neurology

## 2015-02-16 VITALS — BP 144/78 | HR 57 | Ht 70.0 in | Wt 250.5 lb

## 2015-02-16 DIAGNOSIS — R269 Unspecified abnormalities of gait and mobility: Secondary | ICD-10-CM | POA: Diagnosis not present

## 2015-02-16 DIAGNOSIS — M21371 Foot drop, right foot: Secondary | ICD-10-CM | POA: Diagnosis not present

## 2015-02-16 DIAGNOSIS — G609 Hereditary and idiopathic neuropathy, unspecified: Secondary | ICD-10-CM | POA: Diagnosis not present

## 2015-02-16 DIAGNOSIS — R251 Tremor, unspecified: Secondary | ICD-10-CM | POA: Diagnosis not present

## 2015-02-16 DIAGNOSIS — M21372 Foot drop, left foot: Secondary | ICD-10-CM

## 2015-02-16 NOTE — Progress Notes (Signed)
Reason for visit: Peripheral neuropathy  Juan Combs is an 79 y.o. male  History of present illness:  Juan Combs is a 79 year old right-handed white male with history of a primarily axonal peripheral neuropathy with bilateral foot drops, right greater than left. Juan Combs has gotten bilateral AFO braces which have been quite helpful for him for balance. He is able to play golf least once a week, and do relatively well with this. He has had 2 falls since last seen, both falls occurred without using AFO braces at home. Juan Combs denies any sleeping problems from Juan neuropathy, he sleeps well at night. He does have a benign essential tremor and takes Mysoline for this. Juan Combs has been stable with both Juan balance, and Juan tremor. He returns this office for an evaluation. He indicates he does have bilateral shoulder arthritis, he does not wish to pursue surgery for this at this time.    Past Medical History  Diagnosis Date  . Hypertension   . Hyperlipidemia   . Urinary retention   . Prostate enlargement   . Neck pain   . Prostate cancer [facility])     currently has seed implant  . Spinal stenosis   . Peripheral neuropathy (HCC)     bilateral feet  . Hereditary and idiopathic peripheral neuropathy 03/05/2014  . Lumbar radiculopathy 03/05/2014  . Tremor   . Gait disorder 03/12/2014  . Foot drop, bilateral 07/28/2014    Past Surgical History  Procedure Laterality Date  . Knee arthroscopy    . Vasotomy    . Nasal sinus surgery    . Vasectomy      Family History  Problem Relation Age of Onset  . Emphysema Father   . Scoliosis Sister     Social history:  reports that he has never smoked. He has never used smokeless tobacco. He reports that he drinks about 3.0 - 3.6 oz of alcohol per week. He reports that he does not use illicit drugs.   No Known Allergies  Medications:  Prior to Admission medications   Medication Sig Start Date End Date Taking? Authorizing Provider    Ascorbic Acid (VITAMIN C) 1000 MG tablet Take 1,000 mg by mouth daily.    Historical Provider, MD  aspirin 81 MG tablet Take 81 mg by mouth daily.    Historical Provider, MD  Azelaic Acid (FINACEA) 15 % cream Apply topically 2 (two) times daily. After skin is thoroughly washed and patted dry, gently but thoroughly massage a thin film of azelaic acid cream into Juan affected area twice daily, in Juan morning and evening.    Historical Provider, MD  baclofen (LIORESAL) 10 MG tablet Take 10 mg by mouth 2 (two) times daily.     Historical Provider, MD  clonazePAM (KLONOPIN) 1 MG tablet Take 1 mg by mouth 2 (two) times daily as needed for anxiety.    Historical Provider, MD  Cyanocobalamin (VITAMIN B 12 PO) Take 1,000 mg by mouth.    Historical Provider, MD  diphenhydramine-acetaminophen (TYLENOL PM) 25-500 MG TABS Take 2 tablets by mouth at bedtime as needed.    Historical Provider, MD  enalapril (VASOTEC) 20 MG tablet Take 20 mg by mouth daily.    Historical Provider, MD  finasteride (PROSCAR) 5 MG tablet Take 5 mg by mouth daily.    Historical Provider, MD  fluticasone (FLONASE) 50 MCG/ACT nasal spray Place into both nostrils daily.    Historical Provider, MD  gabapentin (NEURONTIN) 300 MG capsule  Take 300 mg by mouth 3 (three) times daily.    Historical Provider, MD  HYDROcodone-acetaminophen (NORCO) 5-325 MG per tablet Take 1-2 tablets by mouth every 4 (four) hours as needed. 01/13/14   Veryl Speak, MD  meloxicam (MOBIC) 7.5 MG tablet Take 7.5 mg by mouth daily.    Historical Provider, MD  mometasone (ELOCON) 0.1 % cream Apply 1 application topically 2 (two) times daily.    Historical Provider, MD  Multiple Vitamin (MULTIVITAMIN) capsule Take 1 capsule by mouth daily.    Historical Provider, MD  Omega-3 Fatty Acids (FISH OIL PO) Take by mouth.    Historical Provider, MD  omeprazole (PRILOSEC) 20 MG capsule Take 1 capsule (20 mg total) by mouth daily. 01/26/13   April Palumbo, MD  polyethylene glycol  powder (MIRALAX) powder Take 17 g by mouth daily. 01/26/13   April Palumbo, MD  primidone (MYSOLINE) 250 MG tablet Take 1 tablet (250 mg total) by mouth 2 (two) times daily. 07/28/14   Kathrynn Ducking, MD  sildenafil (VIAGRA) 100 MG tablet Take 100 mg by mouth daily as needed for erectile dysfunction.    Historical Provider, MD  simvastatin (ZOCOR) 20 MG tablet Take 20 mg by mouth daily.    Historical Provider, MD  tamsulosin (FLOMAX) 0.4 MG CAPS capsule Take 0.4 mg by mouth.    Historical Provider, MD  traMADol (ULTRAM) 50 MG tablet Take 50 mg by mouth every 6 (six) hours as needed. 07/13/14   Historical Provider, MD    ROS:  Out of a complete 14 system review of symptoms, Juan Combs complains only of Juan following symptoms, and all other reviewed systems are negative.  Gait disorder Numbness  Blood pressure 144/78, pulse 57, height 5\' 10"  (1.778 m), weight 250 lb 8 oz (113.626 kg).  Physical Exam  General: Juan Combs is alert and cooperative at Juan time of Juan examination. Juan Combs is moderately obese.  Skin: No significant peripheral edema is noted.   Neurologic Exam  Mental status: Juan Combs is alert and oriented x 3 at Juan time of Juan examination. Juan Combs has apparent normal recent and remote memory, with an apparently normal attention span and concentration ability.   Cranial nerves: Facial symmetry is present. Speech is normal, no aphasia or dysarthria is noted. Extraocular movements are full. Visual fields are full.  Motor: Juan Combs has good strength in all 4 extremities, with exception of bilateral foot drops, right greater than left.  Sensory examination: Soft touch sensation is symmetric on Juan face, arms, and legs. Juan Combs has a stocking pattern pinprick sensory deficit up to Juan knees bilaterally.  Coordination: Juan Combs has good finger-nose-finger and heel-to-shin bilaterally.  Gait and station: Juan Combs has a slightly wide-based gait. Tandem  gait was not attempted. Romberg is negative. No drift is seen.  Reflexes: Deep tendon reflexes are symmetric, but are depressed.   Assessment/Plan:  1. Peripheral neuropathy, axonal  2. Benign essential tremor  3. Gait disorder  Juan Combs has been quite stable over time. Juan Combs is very functional with Juan AFO braces. He will continue Juan Mysoline for now, we will not increase Juan dose. He will follow-up in one year, sooner if needed. Juan Combs indicates that he does get annual blood work through his primary care physician.   Jill Alexanders MD 02/16/2015 7:26 PM  [facility] [address] [place],  [address]  Phone [phone] Fax [phone]

## 2015-02-16 NOTE — Patient Instructions (Signed)
Fall Prevention in the Home  Falls can cause injuries and can affect people from all age groups. There are many simple things that you can do to make your home safe and to help prevent falls. WHAT CAN I DO ON THE OUTSIDE OF MY HOME?  Regularly repair the edges of walkways and driveways and fix any cracks.  Remove high doorway thresholds.  Trim any shrubbery on the main path into your home.  Use bright outdoor lighting.  Clear walkways of debris and clutter, including tools and rocks.  Regularly check that handrails are securely fastened and in good repair. Both sides of any steps should have handrails.  Install guardrails along the edges of any raised decks or porches.  Have leaves, snow, and ice cleared regularly.  Use sand or salt on walkways during winter months.  In the garage, clean up any spills right away, including grease or oil spills. WHAT CAN I DO IN THE BATHROOM?  Use night lights.  Install grab bars by the toilet and in the tub and shower. Do not use towel bars as grab bars.  Use non-skid mats or decals on the floor of the tub or shower.  If you need to sit down while you are in the shower, use a plastic, non-slip stool..  Keep the floor dry. Immediately clean up any water that spills on the floor.  Remove soap buildup in the tub or shower on a regular basis.  Attach bath mats securely with double-sided non-slip rug tape.  Remove throw rugs and other tripping hazards from the floor. WHAT CAN I DO IN THE BEDROOM?  Use night lights.  Make sure that a bedside light is easy to reach.  Do not use oversized bedding that drapes onto the floor.  Have a firm chair that has side arms to use for getting dressed.  Remove throw rugs and other tripping hazards from the floor. WHAT CAN I DO IN THE KITCHEN?   Clean up any spills right away.  Avoid walking on wet floors.  Place frequently used items in easy-to-reach places.  If you need to reach for something  above you, use a sturdy step stool that has a grab bar.  Keep electrical cables out of the way.  Do not use floor polish or wax that makes floors slippery. If you have to use wax, make sure that it is non-skid floor wax.  Remove throw rugs and other tripping hazards from the floor. WHAT CAN I DO IN THE STAIRWAYS?  Do not leave any items on the stairs.  Make sure that there are handrails on both sides of the stairs. Fix handrails that are broken or loose. Make sure that handrails are as long as the stairways.  Check any carpeting to make sure that it is firmly attached to the stairs. Fix any carpet that is loose or worn.  Avoid having throw rugs at the top or bottom of stairways, or secure the rugs with carpet tape to prevent them from moving.  Make sure that you have a light switch at the top of the stairs and the bottom of the stairs. If you do not have them, have them installed. WHAT ARE SOME OTHER FALL PREVENTION TIPS?  Wear closed-toe shoes that fit well and support your feet. Wear shoes that have rubber soles or low heels.  When you use a stepladder, make sure that it is completely opened and that the sides are firmly locked. Have someone hold the ladder while you   are using it. Do not climb a closed stepladder.  Add color or contrast paint or tape to grab bars and handrails in your home. Place contrasting color strips on the first and last steps.  Use mobility aids as needed, such as canes, walkers, scooters, and crutches.  Turn on lights if it is dark. Replace any light bulbs that burn out.  Set up furniture so that there are clear paths. Keep the furniture in the same spot.  Fix any uneven floor surfaces.  Choose a carpet design that does not hide the edge of steps of a stairway.  Be aware of any and all pets.  Review your medicines with your healthcare provider. Some medicines can cause dizziness or changes in blood pressure, which increase your risk of falling. Talk  with your health care provider about other ways that you can decrease your risk of falls. This may include working with a physical therapist or trainer to improve your strength, balance, and endurance.   This information is not intended to replace advice given to you by your health care provider. Make sure you discuss any questions you have with your health care provider.   Document Released: 04/14/2002 Document Revised: 09/08/2014 Document Reviewed: 05/29/2014 Elsevier Interactive Patient Education 2016 [employer]  

## 2015-06-09 ENCOUNTER — Telehealth: Payer: Self-pay | Admitting: Neurology

## 2015-06-09 MED ORDER — PRIMIDONE 250 MG PO TABS: 250.0000 mg | ORAL_TABLET | Freq: Two times a day (BID) | ORAL | Status: DC

## 2015-06-09 NOTE — Telephone Encounter (Signed)
Pt needs refill on primidone (MYSOLINE) 250 MG tablet, pt changed pharmacies. Please send to [facility] on [address] . Thank you

## 2015-11-23 ENCOUNTER — Telehealth: Payer: Self-pay | Admitting: Neurology

## 2015-11-23 NOTE — Telephone Encounter (Signed)
Returned pt TC. He had 1 year follow-up scheduled in October which he asked to cancel d/t plans for being out of town during that time.  Let him know that referral was not needed and sooner appt scheduled for next week.

## 2015-11-23 NOTE — Telephone Encounter (Signed)
Patient called to advise, he rolled out of bed Saturday, hit head on night stand, shoulder on floor, yesterday started having tingling, burning sensation just above left wrist, tingling in both thumbs, has sore spot just above left eye, states it kept him awake a little while last night. Would like appointment to see Dr. Jannifer Franklin, advised patient to contact PCP for new referral for this issue. Patient states he will be unavailable for appointment this week, mother in law died, will be going to [place] on Thursday immediately after wife's appointments, for funeral. Wife's cancer has come back, is inoperable, wife has appointment at [facility] Wednesday and other appointments on Thursday.

## 2015-11-30 ENCOUNTER — Encounter: Payer: Self-pay | Admitting: Neurology

## 2015-11-30 ENCOUNTER — Ambulatory Visit (INDEPENDENT_AMBULATORY_CARE_PROVIDER_SITE_OTHER): Payer: Medicare PPO | Admitting: Neurology

## 2015-11-30 VITALS — BP 138/79 | HR 62 | Ht 70.0 in | Wt 251.8 lb

## 2015-11-30 DIAGNOSIS — M21372 Foot drop, left foot: Secondary | ICD-10-CM | POA: Diagnosis not present

## 2015-11-30 DIAGNOSIS — R251 Tremor, unspecified: Secondary | ICD-10-CM | POA: Diagnosis not present

## 2015-11-30 DIAGNOSIS — R202 Paresthesia of skin: Secondary | ICD-10-CM

## 2015-11-30 DIAGNOSIS — M21371 Foot drop, right foot: Secondary | ICD-10-CM

## 2015-11-30 DIAGNOSIS — G609 Hereditary and idiopathic neuropathy, unspecified: Secondary | ICD-10-CM

## 2015-11-30 DIAGNOSIS — R269 Unspecified abnormalities of gait and mobility: Secondary | ICD-10-CM

## 2015-11-30 MED ORDER — ALPRAZOLAM 0.5 MG PO TABS: ORAL_TABLET | ORAL | 0 refills | Status: DC

## 2015-11-30 NOTE — Progress Notes (Signed)
Reason for visit: Peripheral neuropathy  Juan Combs is an 80 y.o. male  History of present illness:  Juan Combs is a 80 year old right-handed white male with a history of a peripheral neuropathy associated with bilateral foot drops. The patient has bilateral AFO braces that have been quite effective, but they are wearing out. He has not had any falls since he has been wearing the AFO braces. The patient reports that he fell out of bed 10 days ago, he struck his head on the nightstand on the left brow, and bruised his left shoulder with the fall. Since the fall, he has had paresthesias and tingling in the thumbs bilaterally and into the forearm on the left. The patient denies any pain in the neck area, or pain radiating into the shoulder blade area. He denies any weakness of the upper extremities, but he does report a problem with both shoulders associated with arthritis. He is followed through an orthopedic surgeon for this. He indicates that he does have known cervical spondylosis. He reports no change in balance or difficulty controlling the bowels or the bladder but he does report chronic issues with urinary frequency. The patient's been under stress recently as his mother-in-law just passed away and his mother has inoperable cancer. The patient takes primidone for tremors, this remains stable. He does have some difficulty with handwriting but he is able to get by fairly well. He does not use a cane or a walker for ambulation. He comes back to this office for an evaluation. He takes gabapentin for the neuropathy pain. He has difficulty getting to sleep at night, once he gets to sleep he is able to rest well.  Past Medical History:  Diagnosis Date  . Foot drop, bilateral 07/28/2014  . Gait disorder 03/12/2014  . Hereditary and idiopathic peripheral neuropathy 03/05/2014  . Hyperlipidemia   . Hypertension   . Lumbar radiculopathy 03/05/2014  . Neck pain   . Peripheral neuropathy (HCC)    bilateral feet  . Prostate cancer [facility])    currently has seed implant  . Prostate enlargement   . Spinal stenosis   . Tremor   . Urinary retention     Past Surgical History:  Procedure Laterality Date  . KNEE ARTHROSCOPY    . NASAL SINUS SURGERY    . VASECTOMY    . VASOTOMY      Family History  Problem Relation Age of Onset  . Emphysema Father   . Scoliosis Sister     Social history:  reports that he has never smoked. He has never used smokeless tobacco. He reports that he drinks about 3.0 - 3.6 oz of alcohol per week . He reports that he does not use drugs.   No Known Allergies  Medications:  Prior to Admission medications   Medication Sig Start Date End Date Taking? Authorizing Provider  amLODipine (NORVASC) 5 MG tablet TK 1 T PO D 07/17/15  Yes Historical Provider, MD  Ascorbic Acid (VITAMIN C) 1000 MG tablet Take 1,000 mg by mouth daily.   Yes Historical Provider, MD  aspirin 81 MG tablet Take 81 mg by mouth daily.   Yes Historical Provider, MD  Azelaic Acid (FINACEA) 15 % cream Apply topically 2 (two) times daily. After skin is thoroughly washed and patted dry, gently but thoroughly massage a thin film of azelaic acid cream into the affected area twice daily, in the morning and evening.   Yes Historical Provider, MD  Cyanocobalamin (VITAMIN B 12  PO) Take 1,000 mg by mouth.   Yes Historical Provider, MD  diphenhydramine-acetaminophen (TYLENOL PM) 25-500 MG TABS Take 2 tablets by mouth at bedtime as needed.   Yes Historical Provider, MD  enalapril (VASOTEC) 20 MG tablet Take 20 mg by mouth daily.   Yes Historical Provider, MD  finasteride (PROSCAR) 5 MG tablet Take 5 mg by mouth daily.   Yes Historical Provider, MD  fluticasone (FLONASE) 50 MCG/ACT nasal spray Place into both nostrils daily.   Yes Historical Provider, MD  gabapentin (NEURONTIN) 300 MG capsule Take 600 mg by mouth 3 (three) times daily.    Yes Historical Provider, MD  mometasone (ELOCON) 0.1 % cream Apply 1  application topically 2 (two) times daily.   Yes Historical Provider, MD  Multiple Vitamin (MULTIVITAMIN) capsule Take 1 capsule by mouth daily.   Yes Historical Provider, MD  Omega-3 Fatty Acids (FISH OIL PO) Take by mouth.   Yes Historical Provider, MD  omeprazole (PRILOSEC) 20 MG capsule Take 1 capsule (20 mg total) by mouth daily. 01/26/13  Yes April Palumbo, MD  primidone (MYSOLINE) 250 MG tablet Take 1 tablet (250 mg total) by mouth 2 (two) times daily. 06/09/15  Yes Kathrynn Ducking, MD  simvastatin (ZOCOR) 20 MG tablet Take 20 mg by mouth daily.   Yes Historical Provider, MD  tamsulosin (FLOMAX) 0.4 MG CAPS capsule Take 0.4 mg by mouth.   Yes Historical Provider, MD  traMADol (ULTRAM) 50 MG tablet Take 50 mg by mouth every 6 (six) hours as needed. 07/13/14  Yes Historical Provider, MD  ALPRAZolam Duanne Moron) 0.5 MG tablet Take 2 tablets approximately 45 minutes prior to the MRI study, take a third tablet if needed. 11/30/15   Kathrynn Ducking, MD    ROS:  Out of a complete 14 system review of symptoms, the patient complains only of the following symptoms, and all other reviewed systems are negative.  Eye itching Constipation Frequency of urination Joint pain, walking difficulty Headache  Blood pressure 138/79, pulse 62, height 5\' 10"  (1.778 m), weight 251 lb 12 oz (114.2 kg).  Physical Exam  General: The patient is alert and cooperative at the time of the examination. The patient is moderately obese.  Skin: No significant peripheral edema is noted.   Neurologic Exam  Mental status: The patient is alert and oriented x 3 at the time of the examination. The patient has apparent normal recent and remote memory, with an apparently normal attention span and concentration ability.   Cranial nerves: Facial symmetry is present. Speech is normal, no aphasia or dysarthria is noted. Extraocular movements are full. Visual fields are full.  Motor: The patient has good strength in all 4  extremities, with exception of significant bilateral foot drops, he wears bilateral AFO braces. The patient has weakness in the upper extremities with supination, external rotation, and abduction at the shoulders.  Sensory examination: Soft touch sensation is symmetric on the face, arms, and legs.  Coordination: The patient has good finger-nose-finger and heel-to-shin bilaterally. Minimal tremor seen with finger-nose-finger bilaterally.  Gait and station: The patient has a slightly wide-based gait. The patient is able to ambulate independently. Tandem gait is unsteady. Romberg is negative.  Reflexes: Deep tendon reflexes are symmetric, but are depressed.   Assessment/Plan:  1. Peripheral neuropathy  2. Bilateral foot drops  3. Essential tremor  4. Mild gait instability  5. Bilateral upper extremity paresthesias following fall  The clinical examination shows bilateral C5 distribution weakness of the upper extremities,  the patient has paresthesias in the C6 nerve root distribution bilaterally. I am concerned that with the fall he may have had a minor contusion of the spinal cord. The patient will be set up for MRI of the cervical spine to exclude significant spinal stenosis. The patient will continue the Mysoline, he will follow-up in 6 months. I will contact him with the results of the MRI. Xanax was given for the MRI scan.  Jill Alexanders MD 11/30/2015 7:57 AM  [facility] [address] [place], [place] [address]  Phone [phone] Fax [phone]

## 2015-12-13 ENCOUNTER — Ambulatory Visit
Admission: RE | Admit: 2015-12-13 | Discharge: 2015-12-13 | Disposition: A | Discharge status: Home / Self Care | Payer: Medicare PPO | Source: Ambulatory Visit | Attending: Neurology | Admitting: Neurology

## 2015-12-13 ENCOUNTER — Telehealth: Payer: Self-pay | Admitting: Neurology

## 2015-12-13 DIAGNOSIS — R202 Paresthesia of skin: Secondary | ICD-10-CM | POA: Diagnosis not present

## 2015-12-13 NOTE — Telephone Encounter (Signed)
  I called patient. The MRI of the cervical spine showed a potential for left C5 nerve root impingement. Clinical examination suggested bilateral C5 weakness, this is not explained on the study. There is no evidence of a spinal cord contusion or spinal cord compression. If the patient has ongoing symptoms with discomfort, epidural steroid injection can be performed.  MRI cervical 12/13/15:  IMPRESSION:    This MRI of the cervical spine shows the following: 1.    At C2-C3, there are degenerative changes causing moderate right foraminal narrowing but no definite nerve root compression. 2.    The C3-C4 interspace appears to be fused. The right facet joint is also fused. There is right foraminal narrowing but no nerve root compression. 3.    At C4-C5, degenerative changes cause moderate to severe left foraminal narrowing that could lead to left C5 nerve root compression. 4.    At C5-C6, there is mild anterolisthesis of C5 upon C6 and severe moderate right facet hypertrophy with bilateral joint effusions. There does not appear to be any nerve root impingement. 5.   At C6-C7 there are degenerative changes but no nerve root compression.

## 2016-02-15 ENCOUNTER — Ambulatory Visit: Payer: Medicare PPO | Admitting: Neurology

## 2016-02-19 ENCOUNTER — Encounter (HOSPITAL_BASED_OUTPATIENT_CLINIC_OR_DEPARTMENT_OTHER): Payer: Self-pay | Admitting: Emergency Medicine

## 2016-02-19 ENCOUNTER — Emergency Department (HOSPITAL_BASED_OUTPATIENT_CLINIC_OR_DEPARTMENT_OTHER): Payer: Medicare PPO

## 2016-02-19 ENCOUNTER — Emergency Department (HOSPITAL_BASED_OUTPATIENT_CLINIC_OR_DEPARTMENT_OTHER)
Admission: EM | Admit: 2016-02-19 | Discharge: 2016-02-19 | Disposition: A | Discharge status: Home / Self Care | Payer: Medicare PPO | Attending: Emergency Medicine | Admitting: Emergency Medicine

## 2016-02-19 DIAGNOSIS — I10 Essential (primary) hypertension: Secondary | ICD-10-CM | POA: Insufficient documentation

## 2016-02-19 DIAGNOSIS — S0101XA Laceration without foreign body of scalp, initial encounter: Secondary | ICD-10-CM | POA: Diagnosis not present

## 2016-02-19 DIAGNOSIS — Z8546 Personal history of malignant neoplasm of prostate: Secondary | ICD-10-CM | POA: Diagnosis not present

## 2016-02-19 DIAGNOSIS — W19XXXA Unspecified fall, initial encounter: Secondary | ICD-10-CM

## 2016-02-19 DIAGNOSIS — Z7982 Long term (current) use of aspirin: Secondary | ICD-10-CM | POA: Insufficient documentation

## 2016-02-19 DIAGNOSIS — Z79899 Other long term (current) drug therapy: Secondary | ICD-10-CM | POA: Insufficient documentation

## 2016-02-19 DIAGNOSIS — W06XXXA Fall from bed, initial encounter: Secondary | ICD-10-CM | POA: Diagnosis not present

## 2016-02-19 DIAGNOSIS — Y939 Activity, unspecified: Secondary | ICD-10-CM | POA: Diagnosis not present

## 2016-02-19 DIAGNOSIS — S098XXA Other specified injuries of head, initial encounter: Secondary | ICD-10-CM | POA: Diagnosis present

## 2016-02-19 DIAGNOSIS — Z791 Long term (current) use of non-steroidal anti-inflammatories (NSAID): Secondary | ICD-10-CM | POA: Insufficient documentation

## 2016-02-19 DIAGNOSIS — M542 Cervicalgia: Secondary | ICD-10-CM | POA: Insufficient documentation

## 2016-02-19 DIAGNOSIS — Y929 Unspecified place or not applicable: Secondary | ICD-10-CM | POA: Insufficient documentation

## 2016-02-19 DIAGNOSIS — Y999 Unspecified external cause status: Secondary | ICD-10-CM | POA: Diagnosis not present

## 2016-02-19 NOTE — ED Notes (Signed)
Pt has ice pack from home on head on arrival.

## 2016-02-19 NOTE — ED Notes (Signed)
Dermabond administered by Dr. Rogene Houston.

## 2016-02-19 NOTE — Discharge Instructions (Signed)
CT of head neck without any acute injuries. Wound care as we described. Allow the Dermabond peel off on its on over time. Return for any new or worse symptoms.

## 2016-02-19 NOTE — ED Provider Notes (Signed)
[facility] DEPT [facility] Provider Note   CSN: [id number] Arrival date & time: 02/19/16  0630     History   Chief Complaint Chief Complaint  Patient presents with  . Fall    HPI Juan Combs is a 80 y.o. male.  Patient status post fall getting out of bed this morning and struck the the bedside nightstand with the left side of his head. Patient with laceration there no loss of consciousness. Patient was some headache and some neck pain but has neck pain often and pre-existing. No other injuries. Tetanus up-to-date. Not on blood thinners.      Past Medical History:  Diagnosis Date  . Foot drop, bilateral 07/28/2014  . Gait disorder 03/12/2014  . Hereditary and idiopathic peripheral neuropathy 03/05/2014  . Hyperlipidemia   . Hypertension   . Lumbar radiculopathy 03/05/2014  . Neck pain   . Peripheral neuropathy (HCC)    bilateral feet  . Prostate cancer [facility])    currently has seed implant  . Prostate enlargement   . Spinal stenosis   . Tremor   . Urinary retention     Patient Active Problem List   Diagnosis Date Noted  . Foot drop, bilateral 07/28/2014  . Tremor 07/28/2014  . Gait disorder 03/12/2014  . Hereditary and idiopathic peripheral neuropathy 03/05/2014  . Lumbar radiculopathy 03/05/2014    Past Surgical History:  Procedure Laterality Date  . KNEE ARTHROSCOPY    . NASAL SINUS SURGERY    . VASECTOMY    . VASOTOMY         Home Medications    Prior to Admission medications   Medication Sig Start Date End Date Taking? Authorizing Provider  amLODipine (NORVASC) 5 MG tablet TK 1 T PO D 07/17/15  Yes Historical Provider, MD  Ascorbic Acid (VITAMIN C) 1000 MG tablet Take 1,000 mg by mouth daily.   Yes Historical Provider, MD  aspirin 81 MG tablet Take 81 mg by mouth daily.   Yes Historical Provider, MD  Azelaic Acid (FINACEA) 15 % cream Apply topically 2 (two) times daily. After skin is thoroughly washed and patted dry, gently but thoroughly massage a  thin film of azelaic acid cream into the affected area twice daily, in the morning and evening.   Yes Historical Provider, MD  Cyanocobalamin (VITAMIN B 12 PO) Take 1,000 mg by mouth.   Yes Historical Provider, MD  diphenhydramine-acetaminophen (TYLENOL PM) 25-500 MG TABS Take 2 tablets by mouth at bedtime as needed.   Yes Historical Provider, MD  enalapril (VASOTEC) 20 MG tablet Take 20 mg by mouth daily.   Yes Historical Provider, MD  fluticasone (FLONASE) 50 MCG/ACT nasal spray Place into both nostrils daily.   Yes Historical Provider, MD  gabapentin (NEURONTIN) 300 MG capsule Take 600 mg by mouth 3 (three) times daily.    Yes Historical Provider, MD  mometasone (ELOCON) 0.1 % cream Apply 1 application topically 2 (two) times daily.   Yes Historical Provider, MD  Multiple Vitamin (MULTIVITAMIN) capsule Take 1 capsule by mouth daily.   Yes Historical Provider, MD  omeprazole (PRILOSEC) 20 MG capsule Take 1 capsule (20 mg total) by mouth daily. 01/26/13  Yes April Palumbo, MD  primidone (MYSOLINE) 250 MG tablet Take 1 tablet (250 mg total) by mouth 2 (two) times daily. 06/09/15  Yes Kathrynn Ducking, MD  simvastatin (ZOCOR) 20 MG tablet Take 20 mg by mouth daily.   Yes Historical Provider, MD  tamsulosin (FLOMAX) 0.4 MG CAPS capsule Take 0.4  mg by mouth.   Yes Historical Provider, MD  traMADol (ULTRAM) 50 MG tablet Take 100 mg by mouth every 6 (six) hours as needed.  07/13/14  Yes Historical Provider, MD  ALPRAZolam Duanne Moron) 0.5 MG tablet Take 2 tablets approximately 45 minutes prior to the MRI study, take a third tablet if needed. 11/30/15   Kathrynn Ducking, MD  finasteride (PROSCAR) 5 MG tablet Take 5 mg by mouth daily.    Historical Provider, MD  Omega-3 Fatty Acids (FISH OIL PO) Take by mouth.    Historical Provider, MD    Family History Family History  Problem Relation Age of Onset  . Emphysema Father   . Scoliosis Sister     Social History Social History  Substance Use Topics  . Smoking  status: Never Smoker  . Smokeless tobacco: Never Used  . Alcohol use 3.0 - 3.6 oz/week    5 - 6 Standard drinks or equivalent per week     Comment: occassionally     Allergies   Review of patient's allergies indicates no known allergies.   Review of Systems Review of Systems  Constitutional: Negative for fever.  HENT: Negative for congestion.   Eyes: Negative for pain and visual disturbance.  Respiratory: Negative for shortness of breath.   Cardiovascular: Negative for chest pain.  Gastrointestinal: Negative for abdominal pain.  Musculoskeletal: Positive for neck pain. Negative for back pain.  Skin: Positive for wound.  Neurological: Positive for headaches.  Hematological: Does not bruise/bleed easily.  Psychiatric/Behavioral: Negative for confusion.     Physical Exam Updated Vital Signs BP 168/79 (BP Location: Left Arm)   Pulse (!) 51   Temp 98.2 F (36.8 C) (Oral)   Resp 16   Ht 5\' 10"  (1.778 m)   Wt 114.3 kg   SpO2 96%   BMI 36.16 kg/m   Physical Exam  Constitutional: He is oriented to person, place, and time. He appears well-developed and well-nourished. No distress.  HENT:  Head: Normocephalic.  Mouth/Throat: Oropharynx is clear and moist.  Left temporal area with 4 cm elliptical laceration skin edges are sealed and together O active bleeding.  Eyes: EOM are normal. Pupils are equal, round, and reactive to light.  Neck: Normal range of motion. Neck supple.  Cardiovascular: Normal rate and regular rhythm.   Pulmonary/Chest: Effort normal and breath sounds normal.  Abdominal: Soft. Bowel sounds are normal.  Musculoskeletal: Normal range of motion.  Neurological: He is alert and oriented to person, place, and time. He displays normal reflexes. No cranial nerve deficit. He exhibits normal muscle tone.  Skin: Skin is warm.  Nursing note and vitals reviewed.    ED Treatments / Results  Labs (all labs ordered are listed, but only abnormal results are  displayed) Labs Reviewed - No data to display  EKG  EKG Interpretation  Date/Time:  Saturday February 19 2016 06:57:18 EDT Ventricular Rate:  53 PR Interval:    QRS Duration: 103 QT Interval:  450 QTC Calculation: 423 R Axis:   9 Text Interpretation:  Sinus rhythm No significant change since last tracing Confirmed by WARD,  DO, KRISTEN [id number]) on 02/19/2016 7:02:16 AM       Radiology Ct Head Wo Contrast  Result Date: 02/19/2016 CLINICAL DATA:  Head injury, hematoma along eye, initial encounter. EXAM: CT HEAD WITHOUT CONTRAST CT CERVICAL SPINE WITHOUT CONTRAST TECHNIQUE: Multidetector CT imaging of the head and cervical spine was performed following the standard protocol without intravenous contrast. Multiplanar CT image reconstructions of  the cervical spine were also generated. COMPARISON:  MR cervical spine 12/13/2015. FINDINGS: CT HEAD FINDINGS Brain: No evidence of an acute infarct, acute hemorrhage, mass lesion, mass effect or hydrocephalus. Atrophy. Mild patchy periventricular low attenuation. Vascular: No hyperdense vessel or unexpected calcification. Skull: No fracture.  Left frontal scalp hematoma. Sinuses/Orbits: Scattered mucosal thickening. No air-fluid levels. Mastoid air cells are clear. Other: None. CT CERVICAL SPINE FINDINGS Alignment: Minimal grade 1 anterolisthesis of C5 on C6, degenerative, stable. Otherwise within normal limits. Skull base and vertebrae: No acute fracture. No primary bone lesion or focal pathologic process. Soft tissues and spinal canal: No prevertebral fluid or swelling. No visible canal hematoma. Disc levels: Advanced degenerative disc disease is C1-2 with loss of disc space height. At C2-3, moderate to severe right neural foraminal narrowing and moderate left neural foraminal narrowing. At C3-4, disc space narrowing with moderate to severe bilateral neural foraminal narrowing. At C4-5, mild to moderate right and moderate to severe left neural foraminal  narrowing. At C5-6, moderate bilateral neural foraminal narrowing. At C6-7, disc space narrowing with moderate bilateral neural foraminal narrowing. At C7-T1, mild right neural foraminal narrowing. Upper chest: Negative. Other: None. IMPRESSION: 1. No acute intracranial abnormality. Left frontal scalp hematoma. No evidence of acute trauma in the cervical spine. 2. Mild atrophy and mild chronic microvascular white matter ischemic changes. 3. Multilevel degenerative disc disease, facet hypertrophy and neural foraminal narrowing. 4. Grade 1 anterolisthesis, C5-6, unchanged. Electronically Signed   By: Lorin Picket M.D.   On: 02/19/2016 07:54   Ct Cervical Spine Wo Contrast  Result Date: 02/19/2016 CLINICAL DATA:  Head injury, hematoma along eye, initial encounter. EXAM: CT HEAD WITHOUT CONTRAST CT CERVICAL SPINE WITHOUT CONTRAST TECHNIQUE: Multidetector CT imaging of the head and cervical spine was performed following the standard protocol without intravenous contrast. Multiplanar CT image reconstructions of the cervical spine were also generated. COMPARISON:  MR cervical spine 12/13/2015. FINDINGS: CT HEAD FINDINGS Brain: No evidence of an acute infarct, acute hemorrhage, mass lesion, mass effect or hydrocephalus. Atrophy. Mild patchy periventricular low attenuation. Vascular: No hyperdense vessel or unexpected calcification. Skull: No fracture.  Left frontal scalp hematoma. Sinuses/Orbits: Scattered mucosal thickening. No air-fluid levels. Mastoid air cells are clear. Other: None. CT CERVICAL SPINE FINDINGS Alignment: Minimal grade 1 anterolisthesis of C5 on C6, degenerative, stable. Otherwise within normal limits. Skull base and vertebrae: No acute fracture. No primary bone lesion or focal pathologic process. Soft tissues and spinal canal: No prevertebral fluid or swelling. No visible canal hematoma. Disc levels: Advanced degenerative disc disease is C1-2 with loss of disc space height. At C2-3, moderate to  severe right neural foraminal narrowing and moderate left neural foraminal narrowing. At C3-4, disc space narrowing with moderate to severe bilateral neural foraminal narrowing. At C4-5, mild to moderate right and moderate to severe left neural foraminal narrowing. At C5-6, moderate bilateral neural foraminal narrowing. At C6-7, disc space narrowing with moderate bilateral neural foraminal narrowing. At C7-T1, mild right neural foraminal narrowing. Upper chest: Negative. Other: None. IMPRESSION: 1. No acute intracranial abnormality. Left frontal scalp hematoma. No evidence of acute trauma in the cervical spine. 2. Mild atrophy and mild chronic microvascular white matter ischemic changes. 3. Multilevel degenerative disc disease, facet hypertrophy and neural foraminal narrowing. 4. Grade 1 anterolisthesis, C5-6, unchanged. Electronically Signed   By: Lorin Picket M.D.   On: 02/19/2016 07:54    Procedures Procedures (including critical care time)  LACERATION REPAIR Performed by: Fredia Sorrow Authorized by: Fredia Sorrow Consent: Verbal consent  obtained. Risks and benefits: risks, benefits and alternatives were discussed Consent given by: patient Patient identity confirmed: provided demographic data Prepped and Draped in normal sterile fashion Wound explored  Laceration Location: Left temporal area  Laceration Length: 4 cm  No Foreign Bodies seen or palpated  Anesthesia: None   Local anesthetic: None   Anesthetic total: None   Irrigation method: syringe Amount of cleaning: standard  Skin closure: Dermabond   Number of sutures: 0   Technique: Dermabond   Patient tolerance: Patient tolerated the procedure well with no immediate complications.   Medications Ordered in ED Medications - No data to display   Initial Impression / Assessment and Plan / ED Course  I have reviewed the triage vital signs and the nursing notes.  Pertinent labs & imaging results that were  available during my care of the patient were reviewed by me and considered in my medical decision making (see chart for details).  Clinical Course    Patient with fall getting out of bed hit his side of his head on the nightstand. 4 cm laceration there that's already skin edges aligned and closed. Sealed with Dermabond. CT of head and neck without any acute injuries. Patient without any other injuries. Patient is not on blood thinners.    Final Clinical Impressions(s) / ED Diagnoses   Final diagnoses:  Laceration of scalp, initial encounter  Fall, initial encounter    New Prescriptions New Prescriptions   No medications on file     Fredia Sorrow, MD 02/19/16 [phone]

## 2016-02-19 NOTE — ED Triage Notes (Signed)
Pt fell out of bed and hit head on night stand. Pt has hematoma and laceration to left side of head.

## 2016-03-12 ENCOUNTER — Other Ambulatory Visit: Payer: Self-pay | Admitting: Neurology

## 2016-05-16 ENCOUNTER — Ambulatory Visit: Payer: Medicare PPO | Admitting: Neurology

## 2016-05-17 ENCOUNTER — Ambulatory Visit: Payer: Medicare PPO | Admitting: Neurology

## 2016-06-07 ENCOUNTER — Other Ambulatory Visit: Payer: Self-pay | Admitting: Neurology

## 2016-07-18 ENCOUNTER — Ambulatory Visit: Payer: Medicare PPO | Admitting: Neurology

## 2016-07-18 ENCOUNTER — Telehealth: Payer: Self-pay | Admitting: Neurology

## 2016-07-18 NOTE — Telephone Encounter (Signed)
This patient canceled same day of revisit appointment. 

## 2016-11-20 ENCOUNTER — Ambulatory Visit (INDEPENDENT_AMBULATORY_CARE_PROVIDER_SITE_OTHER): Payer: Medicare PPO | Admitting: Neurology

## 2016-11-20 ENCOUNTER — Encounter: Payer: Self-pay | Admitting: Neurology

## 2016-11-20 VITALS — BP 120/51 | HR 64 | Ht 70.0 in | Wt 260.5 lb

## 2016-11-20 DIAGNOSIS — M21372 Foot drop, left foot: Secondary | ICD-10-CM

## 2016-11-20 DIAGNOSIS — G609 Hereditary and idiopathic neuropathy, unspecified: Secondary | ICD-10-CM

## 2016-11-20 DIAGNOSIS — M21371 Foot drop, right foot: Secondary | ICD-10-CM | POA: Diagnosis not present

## 2016-11-20 DIAGNOSIS — R251 Tremor, unspecified: Secondary | ICD-10-CM | POA: Diagnosis not present

## 2016-11-20 DIAGNOSIS — R269 Unspecified abnormalities of gait and mobility: Secondary | ICD-10-CM | POA: Diagnosis not present

## 2016-11-20 MED ORDER — PRIMIDONE 250 MG PO TABS: ORAL_TABLET | ORAL | 3 refills | Status: DC

## 2016-11-20 NOTE — Progress Notes (Signed)
Reason for visit: Peripheral neuropathy, essential tremor  Juan Combs is an 81 y.o. male  History of present illness:  [name] is a 81 year old right-handed white male with a history of a peripheral neuropathy, the patient has bilateral foot drops, he uses AFO braces which offered good stability with ambulation. He has not had any recent falls. The patient also has an essential tremor, he has some difficulty signing his name, but the tremor rarely severely interferes with his ability to function. He is spending increasing amounts of time caring for his wife who has cancer. The patient does play golf, he is able to function fairly well doing this. The patient also has a history of prostate cancer, he has had radium seed implants. He is getting blood work on a regular basis, his primary doctor checks liver enzymes at least annually. He returns to this office for an evaluation.  Past Medical History:  Diagnosis Date  . Foot drop, bilateral 07/28/2014  . Gait disorder 03/12/2014  . Hereditary and idiopathic peripheral neuropathy 03/05/2014  . Hyperlipidemia   . Hypertension   . Lumbar radiculopathy 03/05/2014  . Neck pain   . Peripheral neuropathy    bilateral feet  . Prostate cancer [facility])    currently has seed implant  . Prostate enlargement   . Spinal stenosis   . Tremor   . Urinary retention     Past Surgical History:  Procedure Laterality Date  . KNEE ARTHROSCOPY    . NASAL SINUS SURGERY    . VASECTOMY    . VASOTOMY      Family History  Problem Relation Age of Onset  . Emphysema Father   . Scoliosis Sister     Social history:  reports that he has never smoked. He has never used smokeless tobacco. He reports that he drinks about 3.0 - 3.6 oz of alcohol per week . He reports that he does not use drugs.   No Known Allergies  Medications:  Prior to Admission medications   Medication Sig Start Date End Date Taking? Authorizing Provider  amLODipine (NORVASC) 5 MG  tablet TK 1 T PO D 07/17/15  Yes [provider]  Ascorbic Acid (VITAMIN C) 1000 MG tablet Take 1,000 mg by mouth daily.   Yes [provider]  aspirin 81 MG tablet Take 81 mg by mouth daily.   Yes [provider]  Azelaic Acid (FINACEA) 15 % cream Apply topically 2 (two) times daily. After skin is thoroughly washed and patted dry, gently but thoroughly massage a thin film of azelaic acid cream into the affected area twice daily, in the morning and evening.   Yes [provider]  Cholecalciferol (VITAMIN D3 PO) Take 50 Units by mouth daily.   Yes [provider]  Cyanocobalamin (VITAMIN B 12 PO) Take 1,000 mg by mouth.   Yes [provider]  diphenhydramine-acetaminophen (TYLENOL PM) 25-500 MG TABS Take 2 tablets by mouth at bedtime as needed.   Yes [provider]  Docusate Calcium (STOOL SOFTENER PO) Take 100 mg by mouth daily as needed.   Yes [provider]  enalapril (VASOTEC) 20 MG tablet Take 20 mg by mouth daily.   Yes [provider]  escitalopram (LEXAPRO) 20 MG tablet Take 20 mg by mouth daily.   Yes [provider]  fluticasone (FLONASE) 50 MCG/ACT nasal spray Place into both nostrils daily.   Yes [provider]  gabapentin (NEURONTIN) 300 MG capsule Take 600 mg  by mouth 3 (three) times daily.    Yes [provider]  mirabegron ER (MYRBETRIQ) 50 MG TB24 tablet Take 50 mg by mouth daily.   Yes [provider]  mometasone (ELOCON) 0.1 % cream Apply 1 application topically 2 (two) times daily.   Yes [provider]  Multiple Vitamin (MULTIVITAMIN) capsule Take 1 capsule by mouth daily.   Yes [provider]  Omega-3 Fatty Acids (FISH OIL PO) Take by mouth.   Yes [provider]  omeprazole (PRILOSEC) 20 MG capsule Take 1 capsule (20 mg total) by mouth daily. 01/26/13  Yes Palumbo, April, MD  primidone (MYSOLINE) 250 MG tablet TAKE 1 TABLET(250 MG) BY  MOUTH TWICE DAILY 06/07/16  Yes Kathrynn Ducking, MD  simvastatin (ZOCOR) 20 MG tablet Take 20 mg by mouth daily.   Yes [provider]  tamsulosin (FLOMAX) 0.4 MG CAPS capsule Take 0.4 mg by mouth.   Yes [provider]  traMADol (ULTRAM) 50 MG tablet Take 100 mg by mouth every 6 (six) hours as needed.  07/13/14  Yes [provider]    ROS:  Out of a complete 14 system review of symptoms, the patient complains only of the following symptoms, and all other reviewed systems are negative.  Fatigue Hearing loss Constipation Frequency of urination, urinary urgency Joint pain, muscle cramps Dizziness, headache Depression, anxiety  Blood pressure (!) 120/51, pulse 64, height 5\' 10"  (1.778 m), weight 260 lb 8 oz (118.2 kg), SpO2 95 %.  Physical Exam  General: The patient is alert and cooperative at the time of the examination. The patient is moderately to markedly obese.  Skin: 2+ edema below the knees is noted bilaterally, the patient wears bilateral AFO braces.   Neurologic Exam  Mental status: The patient is alert and oriented x 3 at the time of the examination. The patient has apparent normal recent and remote memory, with an apparently normal attention span and concentration ability.   Cranial nerves: Facial symmetry is present. Speech is normal, no aphasia or dysarthria is noted. Extraocular movements are full. Visual fields are full.  Motor: The patient has good strength in all 4 extremities, with exception of bilateral foot drops. The patient also has some weakness with external rotation of the arms bilaterally and with deltoid muscle strength bilaterally, right greater than left.  Sensory examination: Soft touch sensation is symmetric on the face, arms, and legs. There is a stocking pattern pinprick sensory deficit one half way up the legs bilaterally.  Coordination: The patient has good finger-nose-finger and heel-to-shin bilaterally. A severe  essential tremor is not noted with finger-nose-finger.  Gait and station: The patient has a minimally wide-based gait, he walks fairly well without assistance with the AFO braces. Tandem gait is slightly unsteady. Romberg is negative. No drift is seen.  Reflexes: Deep tendon reflexes are symmetric, but are depressed.    MRI cervical 12/13/15:  IMPRESSION: This MRI of the cervical spine shows the following: 1. At C2-C3, there are degenerative changes causing moderate right foraminal narrowing but no definite nerve root compression. 2. The C3-C4 interspace appears to be fused. The right facet joint is also fused. There is right foraminal narrowing but no nerve root compression. 3. At C4-C5, degenerative changes cause moderate to severe left foraminal narrowing that could lead to left C5 nerve root compression. 4. At C5-C6, there is mild anterolisthesis of C5 upon C6 and severe moderate right facet hypertrophy with bilateral joint effusions. There does not appear  to be any nerve root impingement. 5. At C6-C7 there are degenerative changes but no nerve root compression.  * MRI scan images were reviewed online. I agree with the written report.    Assessment/Plan:  1. Peripheral neuropathy  2. Bilateral foot drops, bilateral AFO braces  3. Essential tremor  The patient gets blood work on a regular basis through his primary care physician. He will continue on the Mysoline taking 50 mg twice daily, a prescription was sent in. The patient is doing well with gait stability and safety with walking. He will follow-up in one year.  Jill Alexanders MD 11/20/2016 11:25 AM  [facility] [address] [place], [place] [address]  Phone [phone] Fax [phone]

## 2016-12-12 ENCOUNTER — Ambulatory Visit: Payer: Medicare PPO | Admitting: Neurology

## 2017-06-24 ENCOUNTER — Other Ambulatory Visit: Payer: Self-pay | Admitting: Neurology

## 2017-08-23 ENCOUNTER — Telehealth: Payer: Self-pay | Admitting: Neurology

## 2017-08-23 NOTE — Telephone Encounter (Signed)
I called the patient.  The patient has appointment in July, he wants to wait until then to get a prescription for the AFO braces.  This revisit will be required before he can get the new braces.

## 2017-08-23 NOTE — Telephone Encounter (Signed)
Pt requesting a call back to discuss getting a new order put in for a new foot braces

## 2017-09-10 IMAGING — CT CT HEAD W/O CM
3 series · 15 of 47 positions shown, 18 images · non-contrast
Comparison: MR cervical spine 12/13/2015.

CLINICAL DATA: Head injury, hematoma along eye, initial encounter.

EXAM:
CT HEAD WITHOUT CONTRAST
CT CERVICAL SPINE WITHOUT CONTRAST
TECHNIQUE: Multidetector CT imaging of the head and cervical spine was
performed following the standard protocol without intravenous
contrast. Multiplanar CT image reconstructions of the cervical spine
were also generated.

[Series 2: head 5.0 h30s · axial · 0.47mm/px · z∈[-179,-44]mm · 9 of 33 slices shown, 12 images]
[im 3/33  brain]
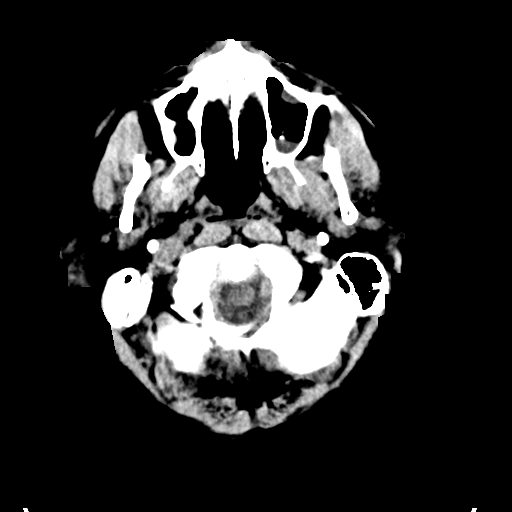
[im 3/33  bone]
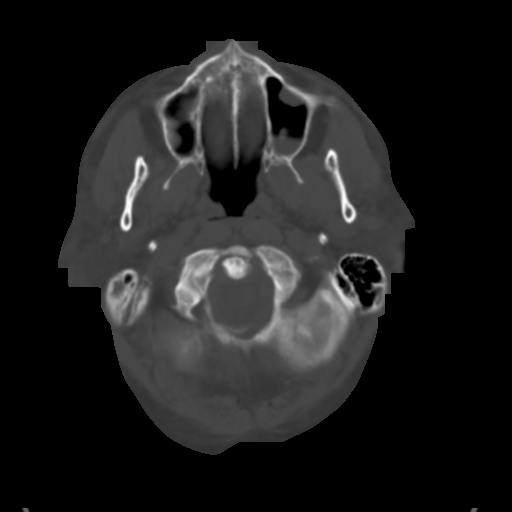
[im 6/33  brain]
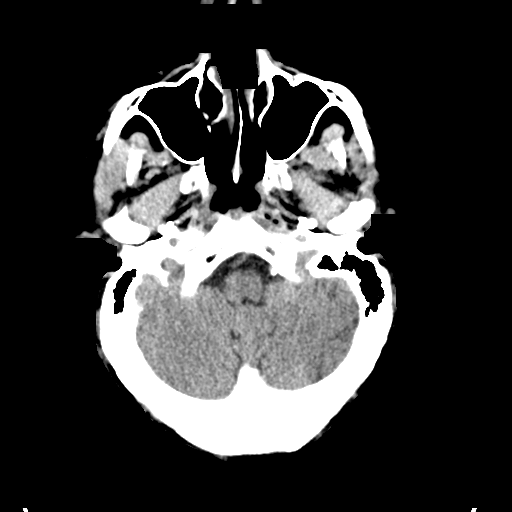
[im 9/33  brain]
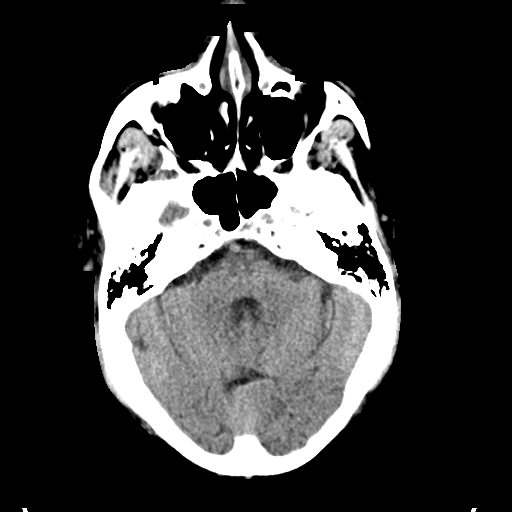
[im 13/33  brain]
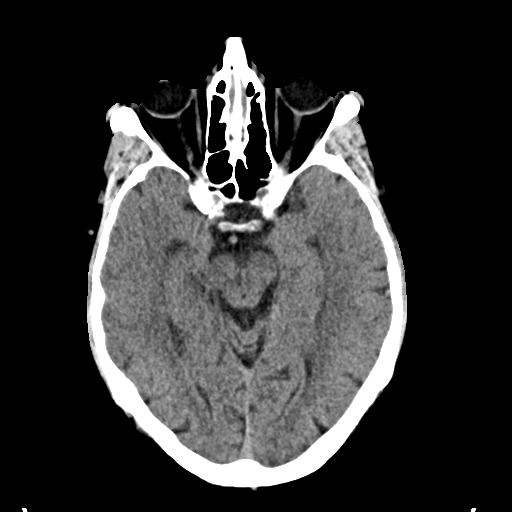
[im 17/33  brain]
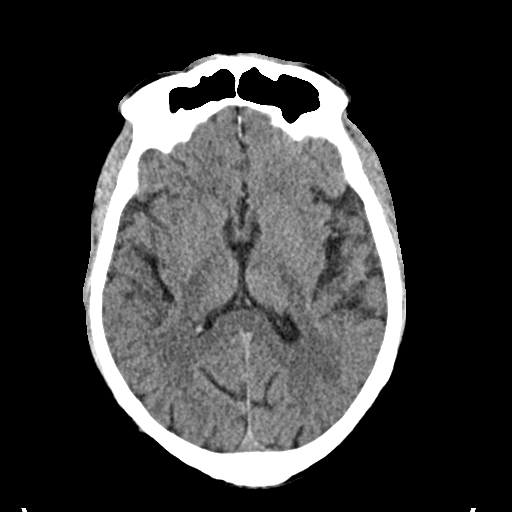
[im 17/33  bone]
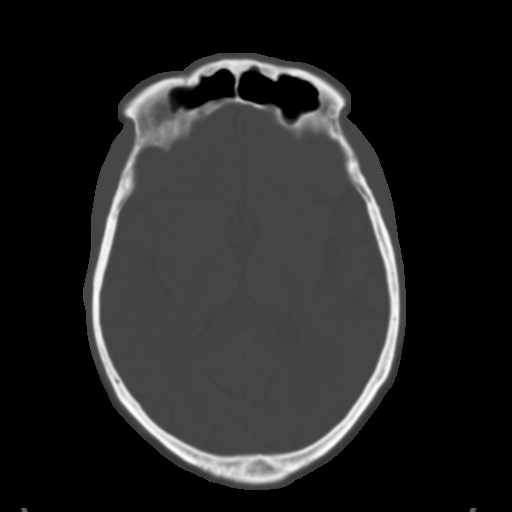
[im 20/33  brain]
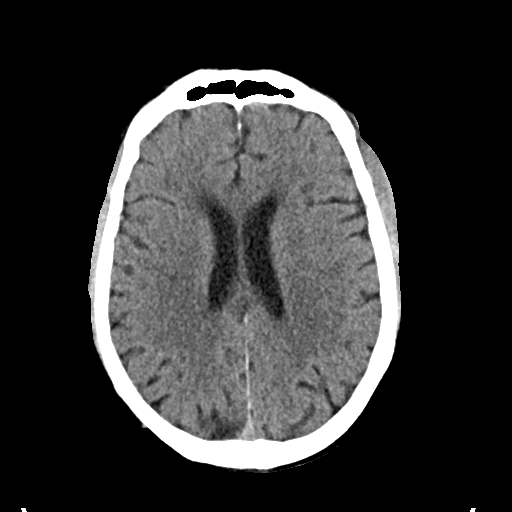
[im 24/33  brain]
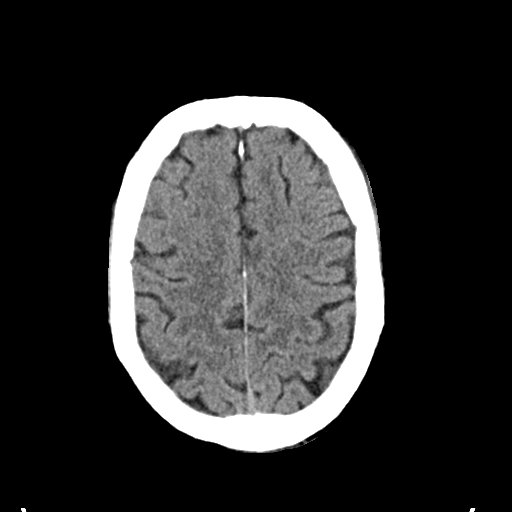
[im 27/33  brain]
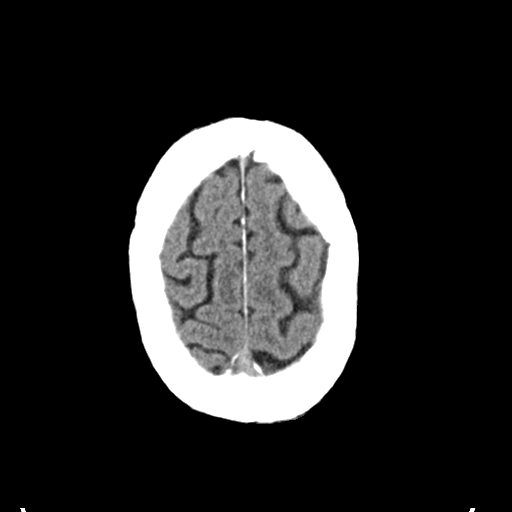
[im 30/33  brain]
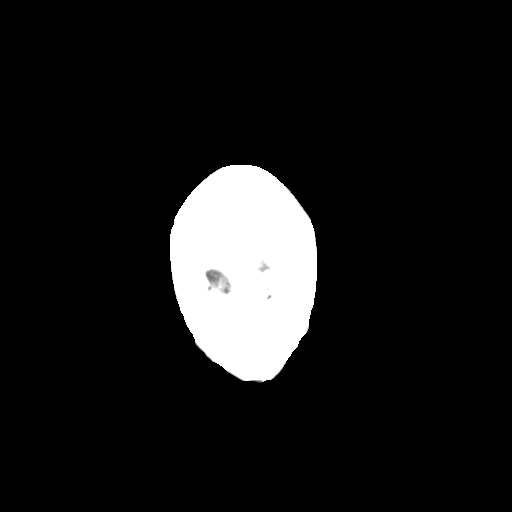
[im 30/33  bone]
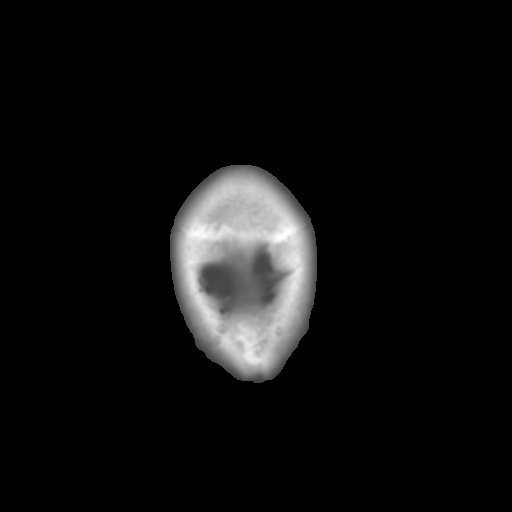

[Series 4: head 3.0 mpr cor · coronal · 0.40mm/px · 3 of 70 slices shown]
[im 25/70  brain]
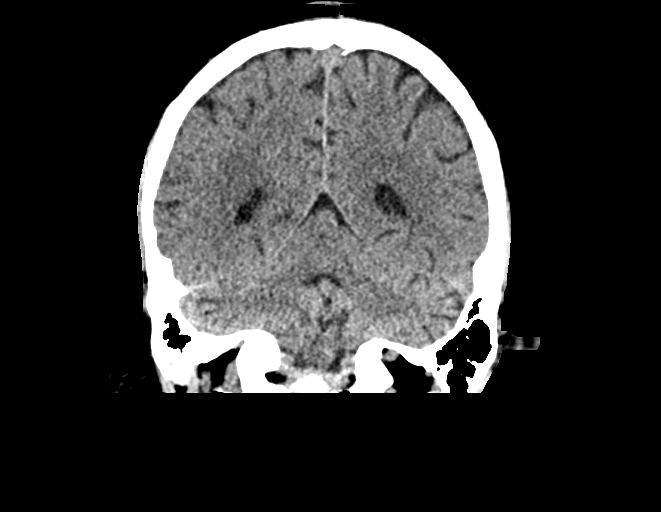
[im 32/70  brain]
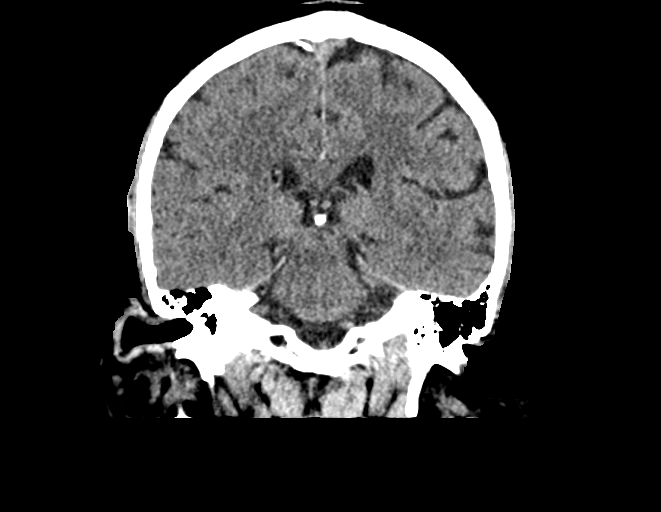
[im 38/70  brain]
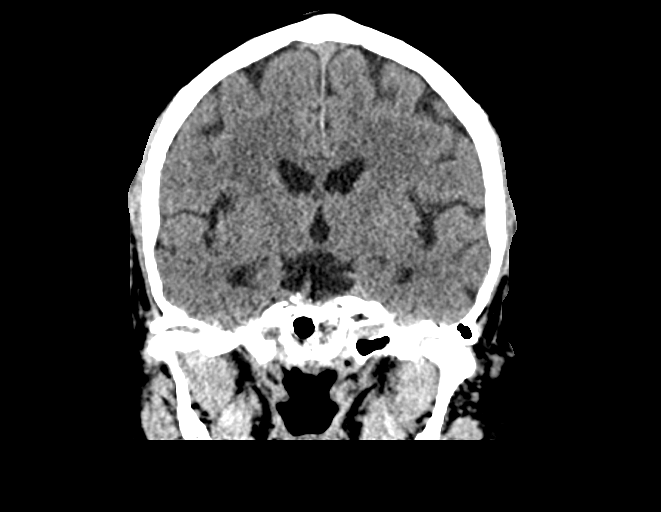

[Series 5: head 3.0 mpr sag · sagittal · 0.40mm/px · 3 of 61 slices shown]
[im 21/61  brain]
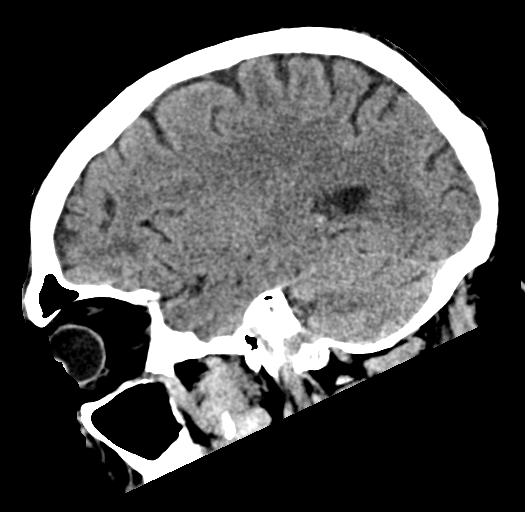
[im 31/61  brain]
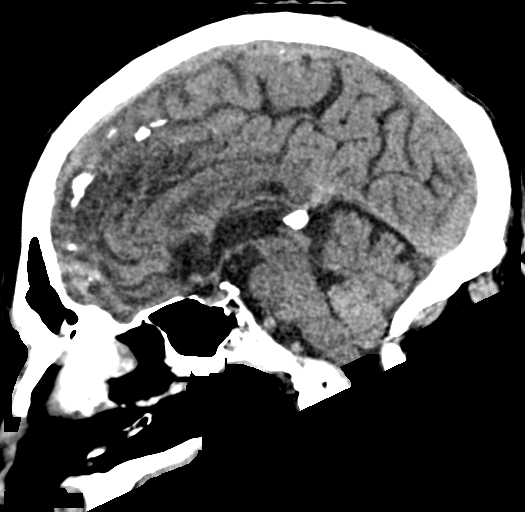
[im 41/61  brain]
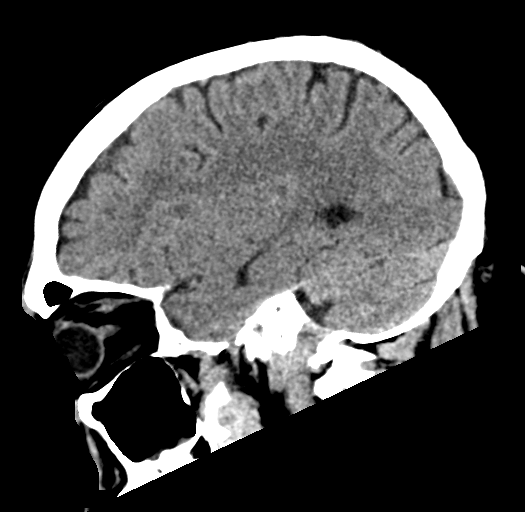

[15 of 47 positions shown; findings below may reference images not displayed]

FINDINGS: CT HEAD FINDINGS

Brain: No evidence of an acute infarct, acute hemorrhage, mass
lesion, mass effect or hydrocephalus. Atrophy. Mild patchy
periventricular low attenuation.

Vascular: No hyperdense vessel or unexpected calcification.

Skull: No fracture.  Left frontal scalp hematoma.

Sinuses/Orbits: Scattered mucosal thickening. No air-fluid levels.
Mastoid air cells are clear.

Other: None.

CT CERVICAL SPINE FINDINGS

Alignment: Minimal grade 1 anterolisthesis of C5 on C6,
degenerative, stable. Otherwise within normal limits.

Skull base and vertebrae: No acute fracture. No primary bone lesion
or focal pathologic process.

Soft tissues and spinal canal: No prevertebral fluid or swelling. No
visible canal hematoma.

Disc levels: Advanced degenerative disc disease is C1-2 with loss of
disc space height. At C2-3, moderate to severe right neural
foraminal narrowing and moderate left neural foraminal narrowing. At
C3-4, disc space narrowing with moderate to severe bilateral neural
foraminal narrowing. At C4-5, mild to moderate right and moderate to
severe left neural foraminal narrowing. At C5-6, moderate bilateral
neural foraminal narrowing. At C6-7, disc space narrowing with
moderate bilateral neural foraminal narrowing. At C7-T1, mild right
neural foraminal narrowing.

Upper chest: Negative.

Other: None.
IMPRESSION: 1. No acute intracranial abnormality. Left frontal scalp hematoma.
No evidence of acute trauma in the cervical spine.
2. Mild atrophy and mild chronic microvascular white matter ischemic
changes.
3. Multilevel degenerative disc disease, facet hypertrophy and
neural foraminal narrowing.
4. Grade 1 anterolisthesis, C5-6, unchanged.

## 2017-09-10 IMAGING — CT CT CERVICAL SPINE W/O CM
3 of 4 series · 12 of 33 positions shown, 14 images · non-contrast
Comparison: MR cervical spine 12/13/2015.

CLINICAL DATA: Head injury, hematoma along eye, initial encounter.

EXAM:
CT HEAD WITHOUT CONTRAST
CT CERVICAL SPINE WITHOUT CONTRAST
TECHNIQUE: Multidetector CT imaging of the head and cervical spine was
performed following the standard protocol without intravenous
contrast. Multiplanar CT image reconstructions of the cervical spine
were also generated.

[Series 3: c_spine 2.0 i30s 3 · axial · 0.33mm/px · z∈[-286,-162]mm · 5 of 94 slices shown, 7 images]
[im 16/94  soft-tissue]
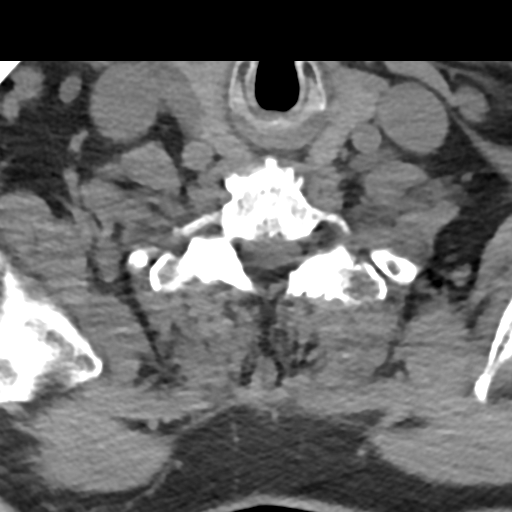
[im 16/94  bone]
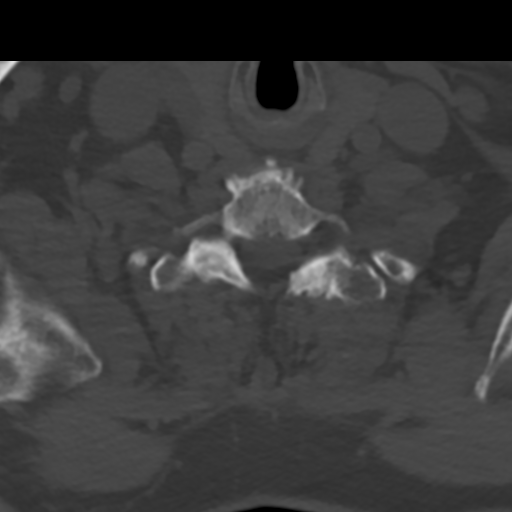
[im 32/94  bone]
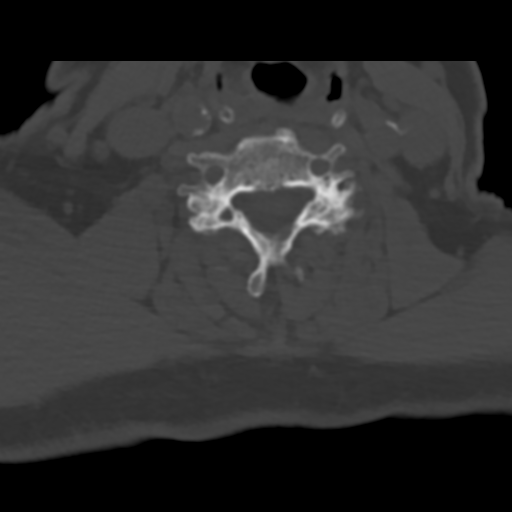
[im 47/94  bone]
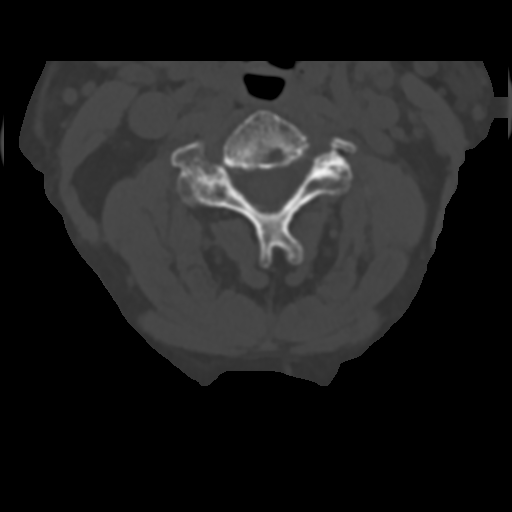
[im 63/94  bone]
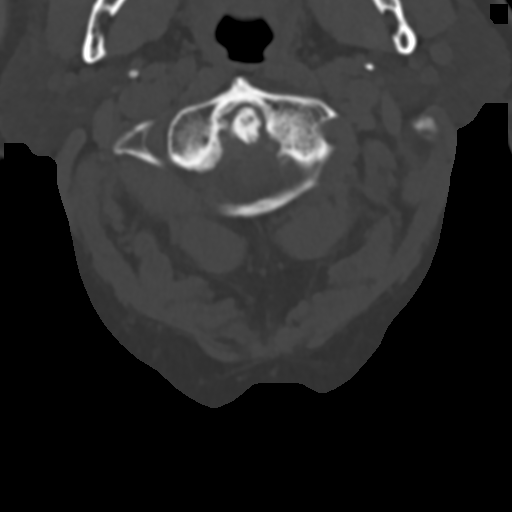
[im 78/94  soft-tissue]
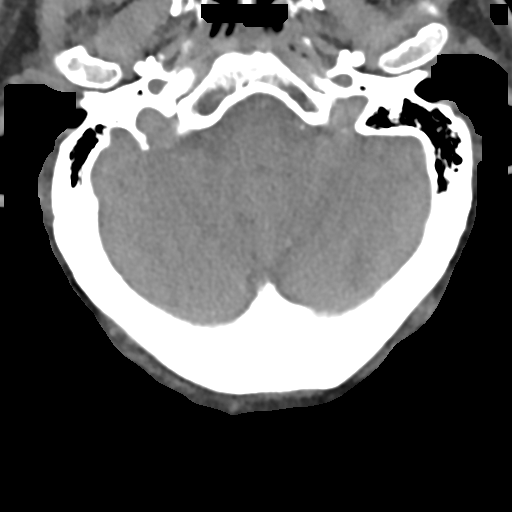
[im 78/94  bone]
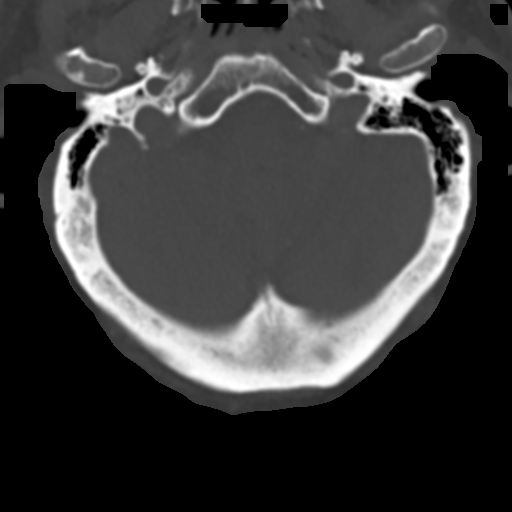

[Series 5: coronals · coronal · 0.28mm/px · 3 of 51 slices shown]
[im 11/51  bone]
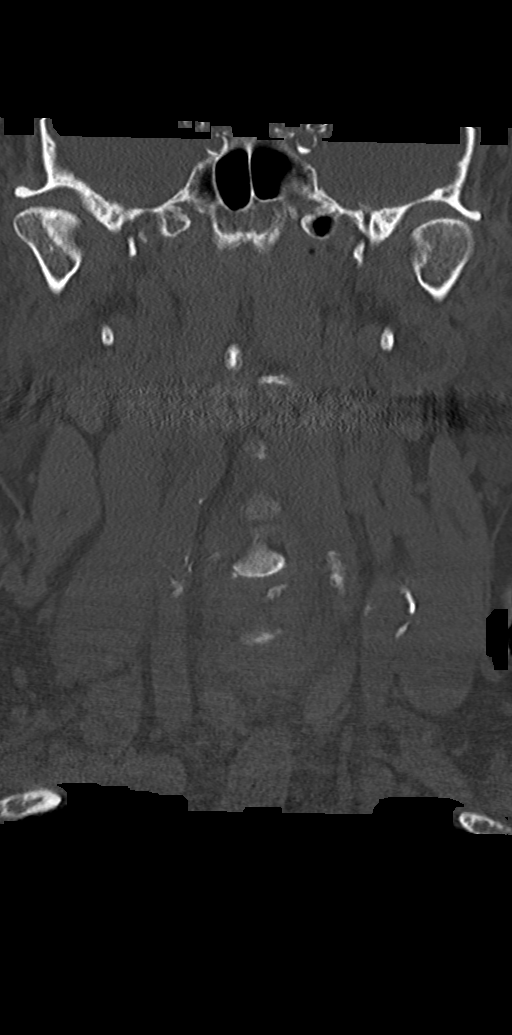
[im 21/51  bone]
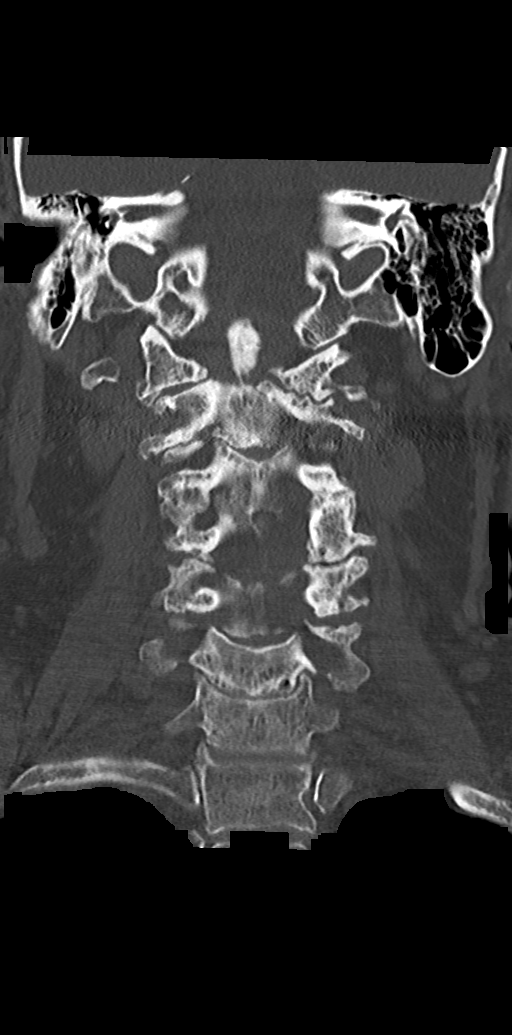
[im 31/51  bone]
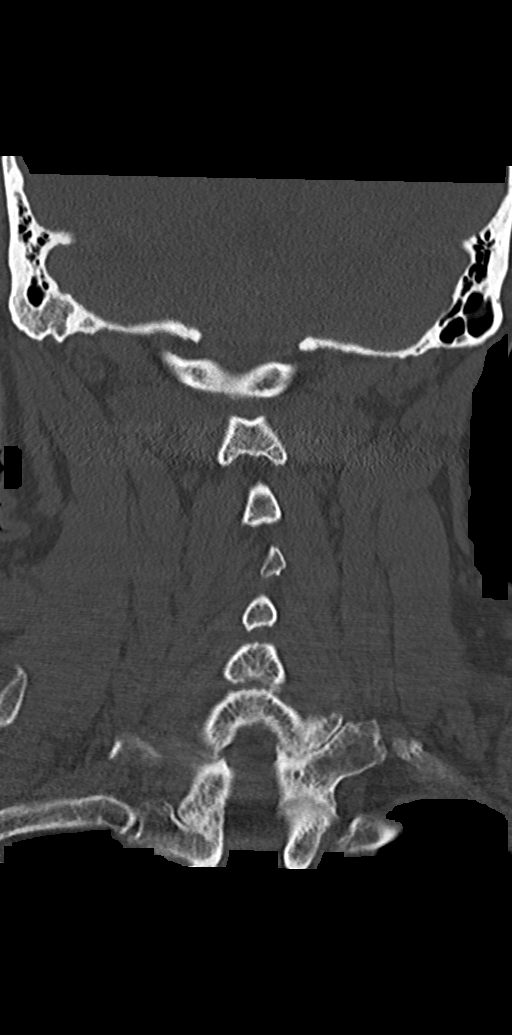

[Series 7: orthogonals · axial · 0.31mm/px · z∈[-314,-219]mm · 4 of 99 slices shown]
[im 17/99  bone]
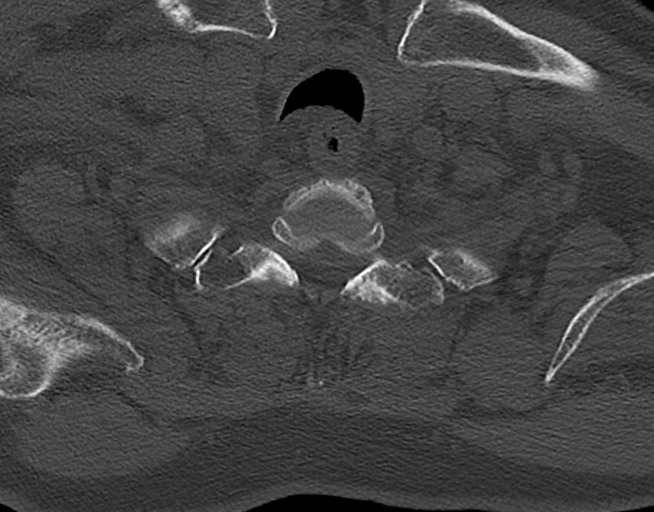
[im 33/99  bone]
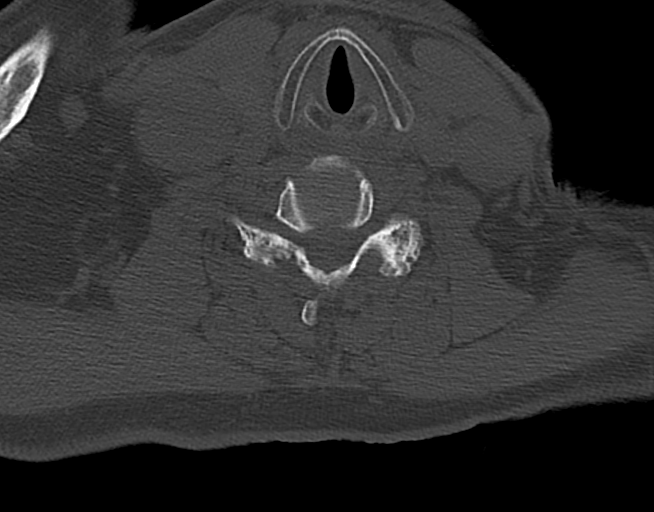
[im 50/99  bone]
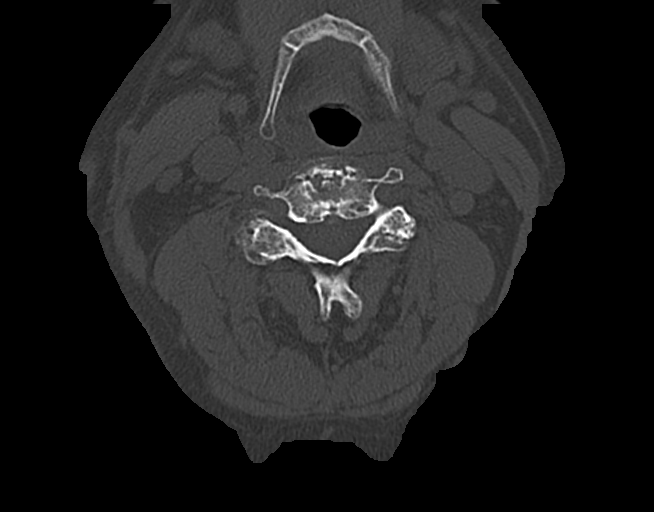
[im 66/99  bone]
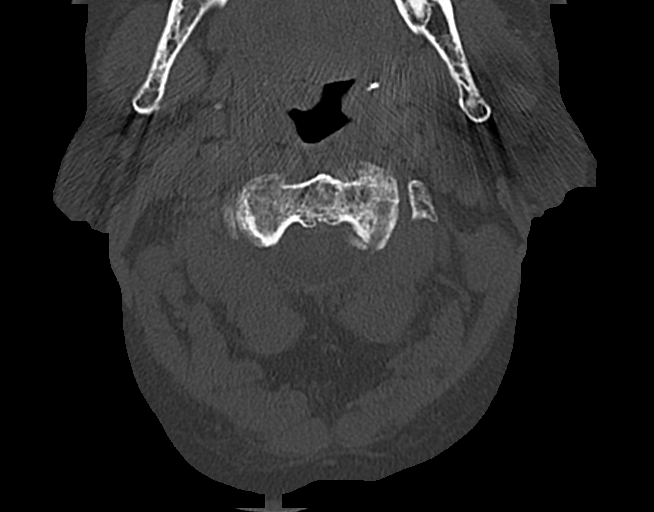

[12 of 33 positions shown; findings below may reference images not displayed]

FINDINGS: CT HEAD FINDINGS

Brain: No evidence of an acute infarct, acute hemorrhage, mass
lesion, mass effect or hydrocephalus. Atrophy. Mild patchy
periventricular low attenuation.

Vascular: No hyperdense vessel or unexpected calcification.

Skull: No fracture.  Left frontal scalp hematoma.

Sinuses/Orbits: Scattered mucosal thickening. No air-fluid levels.
Mastoid air cells are clear.

Other: None.

CT CERVICAL SPINE FINDINGS

Alignment: Minimal grade 1 anterolisthesis of C5 on C6,
degenerative, stable. Otherwise within normal limits.

Skull base and vertebrae: No acute fracture. No primary bone lesion
or focal pathologic process.

Soft tissues and spinal canal: No prevertebral fluid or swelling. No
visible canal hematoma.

Disc levels: Advanced degenerative disc disease is C1-2 with loss of
disc space height. At C2-3, moderate to severe right neural
foraminal narrowing and moderate left neural foraminal narrowing. At
C3-4, disc space narrowing with moderate to severe bilateral neural
foraminal narrowing. At C4-5, mild to moderate right and moderate to
severe left neural foraminal narrowing. At C5-6, moderate bilateral
neural foraminal narrowing. At C6-7, disc space narrowing with
moderate bilateral neural foraminal narrowing. At C7-T1, mild right
neural foraminal narrowing.

Upper chest: Negative.

Other: None.
IMPRESSION: 1. No acute intracranial abnormality. Left frontal scalp hematoma.
No evidence of acute trauma in the cervical spine.
2. Mild atrophy and mild chronic microvascular white matter ischemic
changes.
3. Multilevel degenerative disc disease, facet hypertrophy and
neural foraminal narrowing.
4. Grade 1 anterolisthesis, C5-6, unchanged.

## 2017-10-06 ENCOUNTER — Encounter: Payer: Self-pay | Admitting: Neurology

## 2017-11-10 ENCOUNTER — Other Ambulatory Visit: Payer: Self-pay | Admitting: Neurology

## 2017-11-27 ENCOUNTER — Encounter: Payer: Self-pay | Admitting: Neurology

## 2017-11-27 ENCOUNTER — Ambulatory Visit: Payer: Medicare Other | Admitting: Neurology

## 2017-11-27 VITALS — BP 148/81 | HR 56 | Ht 70.0 in | Wt 248.5 lb

## 2017-11-27 DIAGNOSIS — R251 Tremor, unspecified: Secondary | ICD-10-CM

## 2017-11-27 DIAGNOSIS — R269 Unspecified abnormalities of gait and mobility: Secondary | ICD-10-CM | POA: Diagnosis not present

## 2017-11-27 DIAGNOSIS — M21371 Foot drop, right foot: Secondary | ICD-10-CM

## 2017-11-27 DIAGNOSIS — R202 Paresthesia of skin: Secondary | ICD-10-CM

## 2017-11-27 DIAGNOSIS — M5416 Radiculopathy, lumbar region: Secondary | ICD-10-CM | POA: Diagnosis not present

## 2017-11-27 DIAGNOSIS — G609 Hereditary and idiopathic neuropathy, unspecified: Secondary | ICD-10-CM

## 2017-11-27 DIAGNOSIS — M21372 Foot drop, left foot: Secondary | ICD-10-CM

## 2017-11-27 NOTE — Progress Notes (Signed)
Reason for visit: Peripheral neuropathy, gait disturbance  Juan Combs is an 82 y.o. male  History of present illness:  Juan Combs is a 82 year old right-handed white male with a history of a progressive peripheral neuropathy and a history of obesity.  The patient has a gait disturbance and has bilateral foot drops associated with his peripheral neuropathy.  He claims that he has been doing quite well, he was walking 2-1/2 miles a day until around 15 July 2017.  The patient was traveling in his motor home, he fell in the motor home and fractured ribs on the right.  The patient has been relatively inactive since that time, he has now recovered from the rib fractures without any residual pain.  He fell a second time 6 weeks after the first fall, he was not using his cane with either of the falls.  The patient has had some difficulty with right leg weakness following the second fall.  He has been followed through a physiatrist for his back pain, he has gotten several injections.  He reports that the back bothers him most when he is standing still, partially stooped when he is working at the [employer].  The patient reports numbness to just below the knees on both legs, he reports new numbness of the hands, he has difficulty buttoning buttons because of this.  He has not had MRI of the lumbar spine done previously, he has had x-rays done and he has had arterial studies of both legs.  He was told he had no fractures on x-ray, the arterial studies were unremarkable.  The patient does have a tremor, he uses low-dose Mysoline, the tremor is not bothersome to him.  He returns for an evaluation.  Past Medical History:  Diagnosis Date  . Foot drop, bilateral 07/28/2014  . Gait disorder 03/12/2014  . Hereditary and idiopathic peripheral neuropathy 03/05/2014  . Hyperlipidemia   . Hypertension   . Lumbar radiculopathy 03/05/2014  . Neck pain   . Peripheral neuropathy    bilateral feet  . Prostate  cancer [facility])    currently has seed implant  . Prostate enlargement   . Spinal stenosis   . Tremor   . Urinary retention     Past Surgical History:  Procedure Laterality Date  . KNEE ARTHROSCOPY    . NASAL SINUS SURGERY    . VASECTOMY    . VASOTOMY      Family History  Problem Relation Age of Onset  . Emphysema Father   . Scoliosis Sister     Social history:  reports that he has never smoked. He has never used smokeless tobacco. He reports that he drinks about 3.0 - 3.6 oz of alcohol per week. He reports that he does not use drugs.   No Known Allergies  Medications:  Prior to Admission medications   Medication Sig Start Date End Date Taking? Authorizing Provider  amLODipine (NORVASC) 5 MG tablet TK 1 T PO D 07/17/15   [provider]  Ascorbic Acid (VITAMIN C) 1000 MG tablet Take 1,000 mg by mouth daily.    [provider]  aspirin 81 MG tablet Take 81 mg by mouth daily.    [provider]  Azelaic Acid (FINACEA) 15 % cream Apply topically 2 (two) times daily. After skin is thoroughly washed and patted dry, gently but thoroughly massage a thin film of azelaic acid cream into the affected area twice daily, in the morning and evening.  [provider]  Cholecalciferol (VITAMIN D3 PO) Take 50 Units by mouth daily.    [provider]  Cyanocobalamin (VITAMIN B 12 PO) Take 1,000 mg by mouth.    [provider]  diphenhydramine-acetaminophen (TYLENOL PM) 25-500 MG TABS Take 2 tablets by mouth at bedtime as needed.    [provider]  Docusate Calcium (STOOL SOFTENER PO) Take 100 mg by mouth daily as needed.    [provider]  enalapril (VASOTEC) 20 MG tablet Take 20 mg by mouth daily.    [provider]  escitalopram (LEXAPRO) 20 MG tablet Take 20 mg by mouth daily.    [provider]  fluticasone (FLONASE) 50 MCG/ACT nasal spray Place into both nostrils daily.    [provider]    gabapentin (NEURONTIN) 300 MG capsule Take 600 mg by mouth 3 (three) times daily.     [provider]  mirabegron ER (MYRBETRIQ) 50 MG TB24 tablet Take 50 mg by mouth daily.    [provider]  mometasone (ELOCON) 0.1 % cream Apply 1 application topically 2 (two) times daily.    [provider]  Multiple Vitamin (MULTIVITAMIN) capsule Take 1 capsule by mouth daily.    [provider]  Omega-3 Fatty Acids (FISH OIL PO) Take by mouth.    [provider]  omeprazole (PRILOSEC) 20 MG capsule Take 1 capsule (20 mg total) by mouth daily. 01/26/13   Palumbo, April, MD  primidone (MYSOLINE) 250 MG tablet TAKE 1 TABLET(250 MG) BY MOUTH TWICE DAILY 11/12/17   Kathrynn Ducking, MD  simvastatin (ZOCOR) 20 MG tablet Take 20 mg by mouth daily.    [provider]  tamsulosin (FLOMAX) 0.4 MG CAPS capsule Take 0.4 mg by mouth.    [provider]  traMADol (ULTRAM) 50 MG tablet Take 100 mg by mouth every 6 (six) hours as needed.  07/13/14   [provider]    ROS:  Out of a complete 14 system review of symptoms, the patient complains only of the following symptoms, and all other reviewed systems are negative.  Hearing loss Constipation Frequency of urination Back pain, aching muscles Headache, numbness, weakness, tremors  Blood pressure (!) 148/81, pulse (!) 56, height 5\' 10"  (1.778 m), weight 248 lb 8 oz (112.7 kg).  Physical Exam  General: The patient is alert and cooperative at the time of the examination.  The patient is moderately obese.  Skin: No significant peripheral edema is noted.  Bilateral AFO braces are noted.   Neurologic Exam  Mental status: The patient is alert and oriented x 3 at the time of the examination. The patient has apparent normal recent and remote memory, with an apparently normal attention span and concentration ability.   Cranial nerves: Facial symmetry is present. Speech is normal, no aphasia or  dysarthria is noted. Extraocular movements are full. Visual fields are full.  Motor: The patient has good strength in all 4 extremities, with exception of bilateral foot drops, mild weakness with flexion at the right knee as noted.  Patient has mild weakness of the APB muscles bilaterally.  Tinel sign in both wrists is negative.  Sensory examination: Soft touch sensation is symmetric on the face, arms, and legs.  Coordination: The patient has good finger-nose-finger and heel-to-shin bilaterally.  Minimal tremors noted with finger-nose-finger on both sides.  Gait and station: The patient has a slightly wide-based gait, the patient uses a cane for ambulation.  He has some hesitancy with  turns.  Tandem gait was not attempted.  Romberg is negative but is unsteady.  Reflexes: Deep tendon reflexes are symmetric, but are reduced throughout.   Assessment/Plan:  1.  Peripheral neuropathy  2.  Bilateral foot drops  3.  Essential tremor  4.  Gait instability  The patient reports new issues with weakness involving the right leg and with numbness involving both hands.  The patient could potentially have carpal tunnel syndrome that could be treated.  The patient will be sent for nerve conduction studies on both arms and right leg, EMG on the right leg.  The patient is to see a surgeon regarding his back issues, he may end up having MRI of the lumbar spine in the near future.  The patient will follow-up in 1 year otherwise, he will be seen for the EMG evaluation.  He was given a prescription for new AFO braces.  Addendum: I have received the records from [facility].  This patient has had MRI evaluations in the past, the study was done in 2015, and then again on 22 Sep 2015.  The patient has mild to moderate multilevel foraminal stenosis and mild central stenosis.  There is a leftward disc protrusion at the L3-4 level with some lateral recess and foraminal stenosis.  Jill Alexanders  MD 11/27/2017 8:25 AM  [facility] [address] [place], [place] [address]  Phone [phone] Fax [phone]

## 2017-11-27 NOTE — Patient Instructions (Signed)
We will get EMG and NCV to look at the nerve function of the arms and right leg.

## 2017-12-05 ENCOUNTER — Ambulatory Visit (INDEPENDENT_AMBULATORY_CARE_PROVIDER_SITE_OTHER): Payer: Medicare Other | Admitting: Neurology

## 2017-12-05 ENCOUNTER — Ambulatory Visit: Payer: Medicare Other | Admitting: Neurology

## 2017-12-05 ENCOUNTER — Encounter: Payer: Self-pay | Admitting: Neurology

## 2017-12-05 DIAGNOSIS — G609 Hereditary and idiopathic neuropathy, unspecified: Secondary | ICD-10-CM

## 2017-12-05 DIAGNOSIS — R202 Paresthesia of skin: Secondary | ICD-10-CM

## 2017-12-05 NOTE — Progress Notes (Signed)
[facility]    Nerve / Sites Muscle Latency Ref. Amplitude Ref. Rel Amp Segments Distance Velocity Ref. Area    ms ms mV mV %  cm m/s m/s mVms  L Median - APB     Wrist APB 5.5 ?4.4 1.4 ?4.0 100 Wrist - APB 7   4.5     Upper arm APB 10.8  1.0  69 Upper arm - Wrist 23 43 ?49 3.2  R Median - APB     Wrist APB 5.2 ?4.4 2.3 ?4.0 100 Wrist - APB 7   6.8     Upper arm APB 10.8  1.9  81.3 Upper arm - Wrist 23 41 ?49 6.8  L Ulnar - ADM     Wrist ADM 3.2 ?3.3 5.3 ?6.0 100 Wrist - ADM 7   12.7     B.Elbow ADM 7.6  5.3  101 B.Elbow - Wrist 20 45 ?49 15.4     A.Elbow ADM 9.8  4.9  92.3 A.Elbow - B.Elbow 10 45 ?49 14.8         A.Elbow - Wrist      R Ulnar - ADM     Wrist ADM 2.7 ?3.3 6.6 ?6.0 100 Wrist - ADM 7   18.0     B.Elbow ADM 7.6  5.4  82.4 B.Elbow - Wrist 20 41 ?49 15.4     A.Elbow ADM 10.1  5.2  95.3 A.Elbow - B.Elbow 10 41 ?49 15.5         A.Elbow - Wrist      R Peroneal - EDB     Ankle EDB NR ?6.5 NR ?2.0 NR Ankle - EDB 9   NR     Fib head EDB NR  NR  NR Fib head - Ankle 33 NR ?44 NR         Pop fossa - Ankle      R Tibial - AH     Ankle AH NR ?5.8 NR ?4.0 NR Ankle - AH 9   NR     Pop fossa AH NR  NR  NR Pop fossa - Ankle 33 NR ?41 NR                 SNC    Nerve / Sites Rec. Site Peak Lat Ref.  Amp Ref. Segments Distance    ms ms V V  cm  L Radial - Anatomical snuff box (Forearm)     Forearm Wrist NR ?2.9 NR ?15 Forearm - Wrist 10  R Radial - Anatomical snuff box (Forearm)     Forearm Wrist NR ?2.9 NR ?15 Forearm - Wrist 10  R Sural - Ankle (Calf)     Calf Ankle NR ?4.4 NR ?6 Calf - Ankle 14  R Superficial peroneal - Ankle     Lat leg Ankle NR ?4.4 NR ?6 Lat leg - Ankle 14  L Median - Orthodromic (Dig II, Mid palm)     Dig II Wrist 6.8 ?3.4 2 ?10 Dig II - Wrist 13  R Median - Orthodromic (Dig II, Mid palm)     Dig II Wrist NR ?3.4 NR ?10 Dig II - Wrist 13  L Ulnar - Orthodromic, (Dig V, Mid palm)     Dig V Wrist NR ?3.1 NR ?5 Dig V - Wrist 11  R Ulnar - Orthodromic, (Dig V, Mid  palm)     Dig V Wrist NR ?3.1 NR ?5 Dig V - Wrist 11  F  Wave    Nerve F Lat Ref.   ms ms  L Ulnar - ADM 35.5 ?32.0  R Ulnar - ADM 34.2 ?32.0         EMG full

## 2017-12-05 NOTE — Progress Notes (Signed)
Please refer to EMG and nerve conduction study procedure note. 

## 2017-12-05 NOTE — Procedures (Signed)
     HISTORY:  Jayvin Hurrell is a 82 year old gentleman with a history of a peripheral neuropathy associated with bilateral foot drops.  The patient has had onset of numbness in both hands.  He has had a fall several months ago and has had increased weakness of the right leg.  He has no pain down the right leg.  He is being evaluated for a possible radiculopathy.  NERVE CONDUCTION STUDIES:  Nerve conduction studies were performed on both upper extremities.  The distal motor latencies for the median nerves were prolonged bilaterally with low motor amplitudes for these nerves bilaterally.  The distal motor latencies and motor amplitudes for the ulnar nerves were normal bilaterally.  The nerve conduction velocities for the median and ulnar nerves were slowed bilaterally.  The sensory latencies for the median nerves were prolonged on the left, absent on the right and absent for the ulnar nerves bilaterally.  The radial sensory latencies were absent bilaterally.  The ulnar F-wave latencies were prolonged bilaterally.  Nerve conduction studies were performed on the right lower extremity.  No response was seen for the peroneal and posterior tibial nerves.  The right sural and peroneal sensory latencies were unobtainable.  EMG STUDIES:  EMG study was performed on the right lower extremity:  The tibialis anterior muscle reveals 2 to 3K motor units with markedly reduced recruitment. No fibrillations or positive waves were seen. The peroneus tertius muscle reveals 2 to 3K motor units with markedly reduced recruitment. No fibrillations or positive waves were seen. The medial gastrocnemius muscle reveals 1 to 3K motor units with markedly reduced recruitment. 1+ fibrillations and positive waves were seen. The vastus lateralis muscle reveals 2 to 4K motor units with decreased recruitment. No fibrillations or positive waves were seen. The iliopsoas muscle reveals 2 to 4K motor units with full recruitment. No  fibrillations or positive waves were seen. The biceps femoris muscle (long head) reveals 2 to 4K motor units with full recruitment. No fibrillations or positive waves were seen. The lumbosacral paraspinal muscles were tested at 3 levels, and revealed no abnormalities of insertional activity at all 3 levels tested. There was good relaxation.   IMPRESSION:  Nerve conduction studies done on both upper extremities and on the right lower extremity shows evidence of a severe, end-stage primarily axonal peripheral neuropathy.  There appears to be diffuse neuropathic changes in both hands, the subjective numbness reported by the patient may likely be related to the neuropathy, not associated with an isolated carpal tunnel syndrome.  EMG evaluation of the right lower extremity shows distal chronic and acute denervation consistent with a peripheral neuropathy, there is no clear evidence of an overlying lumbosacral radiculopathy.  Jill Alexanders MD 12/05/2017 2:02 PM  [facility] [address] [place], [place] [address]  Phone [phone] Fax [phone]

## 2017-12-06 ENCOUNTER — Other Ambulatory Visit: Payer: Self-pay | Admitting: Rehabilitation

## 2017-12-06 ENCOUNTER — Ambulatory Visit
Admission: RE | Admit: 2017-12-06 | Discharge: 2017-12-06 | Disposition: A | Discharge status: Home / Self Care | Payer: Self-pay | Source: Ambulatory Visit | Attending: Rehabilitation | Admitting: Rehabilitation

## 2017-12-06 DIAGNOSIS — M48061 Spinal stenosis, lumbar region without neurogenic claudication: Secondary | ICD-10-CM

## 2017-12-10 ENCOUNTER — Other Ambulatory Visit: Payer: Self-pay | Admitting: Rehabilitation

## 2017-12-11 ENCOUNTER — Ambulatory Visit
Admission: RE | Admit: 2017-12-11 | Discharge: 2017-12-11 | Disposition: A | Discharge status: Home / Self Care | Payer: Medicare Other | Source: Ambulatory Visit | Attending: Rehabilitation | Admitting: Rehabilitation

## 2017-12-11 ENCOUNTER — Other Ambulatory Visit: Payer: Self-pay | Admitting: Rehabilitation

## 2017-12-11 DIAGNOSIS — M4802 Spinal stenosis, cervical region: Secondary | ICD-10-CM

## 2017-12-24 ENCOUNTER — Telehealth: Payer: Self-pay | Admitting: Neurology

## 2017-12-24 NOTE — Telephone Encounter (Signed)
Events noted.  Patient has spinal cord compression at the C4-5 level that will require decompression.  The patient has a severe peripheral neuropathy that masks the myelopathic findings.  His gait instability will likely therefore continue even following the cervical spine surgery, a peripheral neuropathy is severe enough that his balance will be an issue in the future and will continue to worsen.

## 2017-12-24 NOTE — Telephone Encounter (Signed)
Juan Combs called stating he had a MRI as ordered from another doctor. Juan Combs wanting to tell Dr. Jannifer Franklin that he will be having surgery, due to bone pressing on his spinal cord at C4. Juan Combs did not wish to have a return call just wanted to inform Dr. Jannifer Franklin.

## 2018-01-04 ENCOUNTER — Encounter (HOSPITAL_BASED_OUTPATIENT_CLINIC_OR_DEPARTMENT_OTHER): Payer: Self-pay

## 2018-01-04 ENCOUNTER — Emergency Department (HOSPITAL_BASED_OUTPATIENT_CLINIC_OR_DEPARTMENT_OTHER)
Admission: EM | Admit: 2018-01-04 | Discharge: 2018-01-04 | Disposition: A | Discharge status: Home / Self Care | Payer: Medicare Other | Attending: Emergency Medicine | Admitting: Emergency Medicine

## 2018-01-04 ENCOUNTER — Other Ambulatory Visit: Payer: Self-pay

## 2018-01-04 DIAGNOSIS — Z7982 Long term (current) use of aspirin: Secondary | ICD-10-CM | POA: Diagnosis not present

## 2018-01-04 DIAGNOSIS — K5903 Drug induced constipation: Secondary | ICD-10-CM

## 2018-01-04 DIAGNOSIS — Z79899 Other long term (current) drug therapy: Secondary | ICD-10-CM | POA: Insufficient documentation

## 2018-01-04 DIAGNOSIS — K5900 Constipation, unspecified: Secondary | ICD-10-CM | POA: Diagnosis present

## 2018-01-04 DIAGNOSIS — I10 Essential (primary) hypertension: Secondary | ICD-10-CM | POA: Insufficient documentation

## 2018-01-04 DIAGNOSIS — Z8546 Personal history of malignant neoplasm of prostate: Secondary | ICD-10-CM | POA: Diagnosis not present

## 2018-01-04 DIAGNOSIS — T40605A Adverse effect of unspecified narcotics, initial encounter: Secondary | ICD-10-CM | POA: Insufficient documentation

## 2018-01-04 MED ORDER — MAGNESIUM CITRATE PO SOLN
1.0000 | Freq: Once | ORAL | Status: AC
  Administered 2018-01-04: 1 via ORAL
  Filled 2018-01-04: qty 296

## 2018-01-04 MED ORDER — MAGNESIUM CITRATE PO SOLN: 1.0000 | Freq: Once | ORAL | 0 refills | Status: DC

## 2018-01-04 MED ORDER — MAGNESIUM CITRATE PO SOLN: 1.0000 | Freq: Once | ORAL | 0 refills | Status: AC

## 2018-01-04 NOTE — ED Notes (Signed)
Pt c/o constipation since sx on 8/21.  He has been taking percocet for pain, as well as stool softeners and miralax. Today he had milk of magnesia this morning, as well as an enema and digital exam this afternoon without results. He had a KUB that showed mild stool burden, no free air. States that when the nurse did his enema, the tip came out clean and that there wasn't a blockage in the rectal vault.

## 2018-01-04 NOTE — ED Provider Notes (Signed)
[facility] EMERGENCY DEPARTMENT Provider Note   CSN: [id number] Arrival date & time: 01/04/18  2022     History   Chief Complaint Chief Complaint  Patient presents with  . Constipation    HPI Juan Combs is a 82 y.o. male.  He is here with his wife and son for evaluation of no bowel movement in the last 10 days.  He is a history of some constipation and usually uses a stool softener and MiraLAX as needed.  He had some spinal surgery last week and needs another spinal surgery next week.  He has been on chronic narcotics because of this as he has radicular pain in his hands.  They have tried some mineral oil and milk of magnesia along with some stool softeners without any relief.  He had a digital exam KUB and an enema today and there was no output.  He really denies any abdominal pain or cramps and is been passing gas.  He is been eating well and not nauseous and not vomiting.  The family was mostly concerned that he may have an obstruction.  Is been no fever no chills.  The history is provided by the patient.  Constipation   This is a new problem. The current episode started more than 1 week ago. Associated symptoms include flatus. Pertinent negatives include no abdominal pain and no dysuria. There is no fiber in the patient's diet. He does not exercise regularly. There has been adequate water intake. He has tried mineral oil, stimulants and osmotic agents for the symptoms. The treatment provided no relief. Risk factors include a recent illness, a change in medication usage/dosage, immobility and diet changes.    Past Medical History:  Diagnosis Date  . Foot drop, bilateral 07/28/2014  . Gait disorder 03/12/2014  . Hereditary and idiopathic peripheral neuropathy 03/05/2014  . Hyperlipidemia   . Hypertension   . Lumbar radiculopathy 03/05/2014  . Neck pain   . Peripheral neuropathy    bilateral feet  . Prostate cancer [facility])    currently has seed implant  . Prostate  enlargement   . Spinal stenosis   . Tremor   . Urinary retention     Patient Active Problem List   Diagnosis Date Noted  . Foot drop, bilateral 07/28/2014  . Tremor 07/28/2014  . Gait disorder 03/12/2014  . Hereditary and idiopathic peripheral neuropathy 03/05/2014  . Lumbar radiculopathy 03/05/2014    Past Surgical History:  Procedure Laterality Date  . CERVICAL SPINE SURGERY    . KNEE ARTHROSCOPY    . NASAL SINUS SURGERY    . VASECTOMY    . VASOTOMY          Home Medications    Prior to Admission medications   Medication Sig Start Date End Date Taking? Authorizing Provider  amLODipine (NORVASC) 5 MG tablet TK 1 T PO D 07/17/15   [provider]  Ascorbic Acid (VITAMIN C) 1000 MG tablet Take 1,000 mg by mouth daily.    [provider]  aspirin 81 MG tablet Take 81 mg by mouth daily.    [provider]  Azelaic Acid (FINACEA) 15 % cream Apply topically 2 (two) times daily. After skin is thoroughly washed and patted dry, gently but thoroughly massage a thin film of azelaic acid cream into the affected area twice daily, in the morning and evening.    [provider]  Cholecalciferol (VITAMIN D3 PO) Take 50 Units by mouth daily.    [provider]  Cyanocobalamin (VITAMIN B 12 PO) Take 1,000 mg by mouth.    [provider]  diphenhydramine-acetaminophen (TYLENOL PM) 25-500 MG TABS Take 2 tablets by mouth at bedtime as needed.    [provider]  Docusate Calcium (STOOL SOFTENER PO) Take 100 mg by mouth daily as needed.    [provider]  enalapril (VASOTEC) 20 MG tablet Take 20 mg by mouth daily.    [provider]  escitalopram (LEXAPRO) 20 MG tablet Take 20 mg by mouth daily.    [provider]  fluticasone (FLONASE) 50 MCG/ACT nasal spray Place into both nostrils daily.    [provider]  gabapentin (NEURONTIN) 300 MG capsule Take 600 mg by mouth 3 (three) times daily.      [provider]  mirabegron ER (MYRBETRIQ) 50 MG TB24 tablet Take 50 mg by mouth daily.    [provider]  mometasone (ELOCON) 0.1 % cream Apply 1 application topically 2 (two) times daily.    [provider]  Multiple Vitamin (MULTIVITAMIN) capsule Take 1 capsule by mouth daily.    [provider]  Omega-3 Fatty Acids (FISH OIL PO) Take by mouth.    [provider]  omeprazole (PRILOSEC) 20 MG capsule Take 1 capsule (20 mg total) by mouth daily. 01/26/13   Palumbo, April, MD  primidone (MYSOLINE) 250 MG tablet TAKE 1 TABLET(250 MG) BY MOUTH TWICE DAILY 11/12/17   Kathrynn Ducking, MD  simvastatin (ZOCOR) 20 MG tablet Take 20 mg by mouth daily.    [provider]  tamsulosin (FLOMAX) 0.4 MG CAPS capsule Take 0.4 mg by mouth.    [provider]  traMADol (ULTRAM) 50 MG tablet Take 100 mg by mouth every 6 (six) hours as needed.  07/13/14   [provider]    Family History Family History  Problem Relation Age of Onset  . Emphysema Father   . Scoliosis Sister     Social History Social History   Tobacco Use  . Smoking status: Never Smoker  . Smokeless tobacco: Never Used  Substance Use Topics  . Alcohol use: Yes    Alcohol/week: 5.0 - 6.0 standard drinks    Types: 5 - 6 Standard drinks or equivalent per week    Comment: occassionally  . Drug use: No     Allergies   Patient has no known allergies.   Review of Systems Review of Systems  Constitutional: Negative for fever.  HENT: Negative for sore throat.   Eyes: Negative for visual disturbance.  Respiratory: Negative for shortness of breath.   Cardiovascular: Negative for chest pain.  Gastrointestinal: Positive for constipation and flatus. Negative for abdominal pain, nausea and vomiting.  Genitourinary: Negative for dysuria.  Musculoskeletal: Positive for neck pain.  Skin: Negative for rash.  Neurological: Positive for weakness and numbness.      Physical Exam Updated Vital Signs BP (!) 158/59 (BP Location: Right Arm)   Pulse 67   Temp 98.2 F (36.8 C) (Oral)   Resp 16   Ht 5\' 9"  (1.753 m)   Wt 109.8 kg   SpO2 96%   BMI 35.74 kg/m   Physical Exam  Constitutional: He appears well-developed and well-nourished.  HENT:  Head: Normocephalic and atraumatic.  Right Ear: External ear normal.  Left Ear: External ear normal.  Nose: Nose normal.  Mouth/Throat: Oropharynx is clear and moist.  Eyes: Conjunctivae are normal.  Neck:  In soft cervical collar  Cardiovascular: Normal rate,  regular rhythm and normal heart sounds.  Pulmonary/Chest: Effort normal and breath sounds normal. He has no wheezes. He has no rales.  Abdominal: Soft. He exhibits no distension and no mass. There is no tenderness. There is no rebound and no guarding.  Musculoskeletal: He exhibits no tenderness or deformity.  Neurological: He is alert. GCS eye subscore is 4. GCS verbal subscore is 5. GCS motor subscore is 6.  Skin: Skin is warm and dry.  Psychiatric: He has a normal mood and affect.  Nursing note and vitals reviewed.    ED Treatments / Results  Labs (all labs ordered are listed, but only abnormal results are displayed) Labs Reviewed - No data to display  EKG None  Radiology No results found.  Procedures Procedures (including critical care time)  Medications Ordered in ED Medications - No data to display   Initial Impression / Assessment and Plan / ED Course  I have reviewed the triage vital signs and the nursing notes.  Pertinent labs & imaging results that were available during my care of the patient were reviewed by me and considered in my medical decision making (see chart for details).     Patient has been eating well. Is on oral narcotics for pain and less mobile since surgery. Eating less roughage due to throat pain from surgery. Is doing laxatives and enema. No impaction since had digital exam and enema today. No  evidence of obstruction. Will try mag citrate. Patient wishes to take it home and do it in am when more prepared if works.   Final Clinical Impressions(s) / ED Diagnoses   Final diagnoses:  Drug-induced constipation    ED Discharge Orders         Ordered    magnesium citrate SOLN   Once,   Status:  Discontinued     01/04/18 2106    magnesium citrate SOLN   Once     01/04/18 2118           Hayden Rasmussen, MD 01/05/18 [phone]

## 2018-01-04 NOTE — ED Notes (Signed)
Pt and family verbalizes understanding of dc instructions and denies any further needs at this time

## 2018-01-04 NOTE — ED Triage Notes (Signed)
C/o constipation x 10 days-states he has been taking narcotic pain med since neck surgery 8/21-NAD-to triage in w/c

## 2018-01-04 NOTE — Discharge Instructions (Addendum)
You were evaluated in the emergency department for not having passed a stool in 10 days and concern for an obstruction.  Your exam here is a very soft abdomen with no masses or tenderness.  There is no vomiting.  I think it very unlikely you have an obstruction and just have very slow transit due to immobility and diet and pain medication.  We are sending you home with a prescription for some magnesium citrate which may help stimulate some stool production.  Please return if any worsening symptoms.

## 2018-01-08 DIAGNOSIS — M4712 Other spondylosis with myelopathy, cervical region: Secondary | ICD-10-CM | POA: Insufficient documentation

## 2018-01-08 DIAGNOSIS — M4722 Other spondylosis with radiculopathy, cervical region: Secondary | ICD-10-CM | POA: Insufficient documentation

## 2018-01-11 ENCOUNTER — Encounter: Payer: Self-pay | Admitting: Internal Medicine

## 2018-01-11 ENCOUNTER — Non-Acute Institutional Stay (SKILLED_NURSING_FACILITY): Payer: Medicare Other | Admitting: Internal Medicine

## 2018-01-11 DIAGNOSIS — N400 Enlarged prostate without lower urinary tract symptoms: Secondary | ICD-10-CM | POA: Diagnosis not present

## 2018-01-11 DIAGNOSIS — I10 Essential (primary) hypertension: Secondary | ICD-10-CM | POA: Diagnosis not present

## 2018-01-11 DIAGNOSIS — M4722 Other spondylosis with radiculopathy, cervical region: Secondary | ICD-10-CM

## 2018-01-11 DIAGNOSIS — M4712 Other spondylosis with myelopathy, cervical region: Secondary | ICD-10-CM

## 2018-01-11 DIAGNOSIS — E785 Hyperlipidemia, unspecified: Secondary | ICD-10-CM

## 2018-01-11 DIAGNOSIS — G609 Hereditary and idiopathic neuropathy, unspecified: Secondary | ICD-10-CM

## 2018-01-11 NOTE — Progress Notes (Signed)
:   Location:  [place] Room Number: 258N Place of Service:  SNF ([id number])  Bexton Haak D. Sheppard Coil, MD  Patient Care Team: Iva Lento, PA-C as PCP - General (Internal Medicine) Zonia Kief, MD as Consulting Physician (Rehabilitation) Berle Mull, MD as Consulting Physician [facility])  Extended Emergency Contact Information Primary Emergency Contact: Loni Muse [place] Phone: [phone] Relation: Son     Allergies: Patient has no known allergies.  Chief Complaint  Patient presents with  . New Admit To SNF    Admit to [facility]    HPI: Patient is 82 y.o. male who went [place] 4-5, C5-6 anterior cervical discectomy and fusion with plate on 10/19/4313 who complained of worsening neck pain after surgery and was to undergo a posterior C4-6 laminectomy.  Patient was admitted to skilled nursing facility for OT/PT between procedures but because of reports of no bowel movement for 20 days patient ultimately went back to [facility] when was admitted to [facility] from 9/1-5 4 severe constipation.  Initially surgeons did not want to operate on the patient until it had a bowel movement but since he did not have a bowel movement they did a C4-6 laminectomy and PSF on patient with no apparent complications.  Patient admitted to skilled nursing facility again for OT/PT while at skilled nursing facility patient will be followed for hypertension treated with Norvasc and Vasotec, BPH treated with Myrbetriq and fluid level 6 and neuropathy treated with Neurontin  Past Medical History:  Diagnosis Date  . Foot drop, bilateral 07/28/2014  . Gait disorder 03/12/2014  . Hereditary and idiopathic peripheral neuropathy 03/05/2014  . Hyperlipidemia   . Hypertension   . Lumbar radiculopathy 03/05/2014  . Neck pain   . Peripheral neuropathy    bilateral feet  . Prostate cancer [facility])    currently has seed  implant  . Prostate enlargement   . Spinal stenosis   . Tremor   . Urinary retention     Past Surgical History:  Procedure Laterality Date  . CERVICAL SPINE SURGERY    . KNEE ARTHROSCOPY    . NASAL SINUS SURGERY    . VASECTOMY    . VASOTOMY      Allergies as of 01/11/2018   No Known Allergies     Medication List        Accurate as of 01/11/18  2:24 PM. Always use your most recent med list.          amLODipine 5 MG tablet Commonly known as:  NORVASC TK 1 T PO D   aspirin 81 MG tablet Take 81 mg by mouth daily.   diphenhydramine-acetaminophen 25-500 MG Tabs tablet Commonly known as:  TYLENOL PM Take 2 tablets by mouth at bedtime as needed.   enalapril 20 MG tablet Commonly known as:  VASOTEC Take 20 mg by mouth daily.   FINACEA 15 % cream Generic drug:  Azelaic Acid Apply topically 2 (two) times daily. After skin is thoroughly washed and patted dry, gently but thoroughly massage a thin film of azelaic acid cream into the affected area twice daily, in the morning and evening.   FISH OIL PO Take by mouth.   fluticasone 50 MCG/ACT nasal spray Commonly known as:  FLONASE Place into both nostrils daily.   gabapentin 300 MG capsule Commonly known as:  NEURONTIN Take 600 mg by mouth 3 (three) times daily.   lactulose 20 g  packet Commonly known as:  CEPHULAC Take 20 g by mouth 2 (two) times daily.   mometasone 0.1 % cream Commonly known as:  ELOCON Apply 1 application topically 2 (two) times daily.   multivitamin capsule Take 1 capsule by mouth daily.   MYRBETRIQ 50 MG Tb24 tablet Generic drug:  mirabegron ER Take 50 mg by mouth daily.   omeprazole 20 MG capsule Commonly known as:  PRILOSEC Take 1 capsule (20 mg total) by mouth daily.   oxyCODONE-acetaminophen 5-325 MG tablet Commonly known as:  PERCOCET/ROXICET Take by mouth every 4 (four) hours as needed for severe pain.   polyethylene glycol packet Commonly known as:  MIRALAX / GLYCOLAX Take 17  g by mouth daily as needed.   primidone 250 MG tablet Commonly known as:  MYSOLINE TAKE 1 TABLET(250 MG) BY MOUTH TWICE DAILY   simvastatin 20 MG tablet Commonly known as:  ZOCOR Take 20 mg by mouth daily.   STOOL SOFTENER PO Take 100 mg by mouth daily as needed.   tamsulosin 0.4 MG Caps capsule Commonly known as:  FLOMAX Take 0.4 mg by mouth.   VITAMIN B 12 PO Take 1,000 mg by mouth.   vitamin C 1000 MG tablet Take 1,000 mg by mouth daily.   VITAMIN D3 PO Take 50 Units by mouth daily.       No orders of the defined types were placed in this encounter.    There is no immunization history on file for this patient.  Social History   Tobacco Use  . Smoking status: Never Smoker  . Smokeless tobacco: Never Used  Substance Use Topics  . Alcohol use: Yes    Alcohol/week: 5.0 - 6.0 standard drinks    Types: 5 - 6 Standard drinks or equivalent per week    Comment: occassionally    Family history is   Family History  Problem Relation Age of Onset  . Emphysema Father   . Scoliosis Sister       Review of Systems  DATA OBTAINED: from patient GENERAL:  no fevers, fatigue, appetite changes SKIN: No itching, or rash EYES: No eye pain, redness, discharge EARS: No earache, tinnitus, change in hearing NOSE: No congestion, drainage or bleeding  MOUTH/THROAT: No mouth or tooth pain, No sore throat RESPIRATORY: No cough, wheezing, SOB CARDIAC: No chest pain, palpitations, lower extremity edema  GI: No abdominal pain, No N/V/D or constipation, No heartburn or reflux  GU: No dysuria, frequency or urgency, or incontinence  MUSCULOSKELETAL: No unrelieved bone/joint pain NEUROLOGIC: No headache, dizziness or focal weakness PSYCHIATRIC: No c/o anxiety or sadness   Vitals:   01/11/18 1401  BP: (!) 188/91  Pulse: (!) 58  Resp: 18  Temp: 99.9 F (37.7 C)    SpO2 Readings from Last 1 Encounters:  01/04/18 96%   Body mass index is 36.03 kg/m.     Physical  Exam  GENERAL APPEARANCE: Alert, conversant,  No acute distress.  Wearing soft collar SKIN: No diaphoresis rash HEAD: Normocephalic, atraumatic  EYES: Conjunctiva/lids clear. Pupils round, reactive. EOMs intact.  EARS: External exam WNL, canals clear. Hearing grossly normal.  NOSE: No deformity or discharge.  MOUTH/THROAT: Lips w/o lesions  RESPIRATORY: Breathing is even, unlabored. Lung sounds are clear   CARDIOVASCULAR: Heart RRR 3/6 systolic murmur, no rubs or gallops. No peripheral edema.   GASTROINTESTINAL: Abdomen is soft, non-tender, not distended w/ normal bowel sounds. GENITOURINARY: Bladder non tender, not distended  MUSCULOSKELETAL: No abnormal joints or musculature NEUROLOGIC:  Cranial nerves 2-12 grossly intact. Moves all extremities  PSYCHIATRIC: Mood and affect appropriate to situation, no behavioral issues  Patient Active Problem List   Diagnosis Date Noted  . Foot drop, bilateral 07/28/2014  . Tremor 07/28/2014  . Gait disorder 03/12/2014  . Hereditary and idiopathic peripheral neuropathy 03/05/2014  . Lumbar radiculopathy 03/05/2014      Labs reviewed: Basic Metabolic Panel:    Component Value Date/Time   NA 138 01/25/2013 2335   K 3.9 01/25/2013 2335   CL 101 01/25/2013 2335   CO2 25 01/25/2013 2335   GLUCOSE 109 (H) 01/25/2013 2335   BUN 17 01/25/2013 2335   CREATININE 0.90 01/25/2013 2335   CALCIUM 9.3 01/25/2013 2335   PROT 6.2 03/12/2014 0948   ALBUMIN 3.9 01/25/2013 2335   AST 20 01/25/2013 2335   ALT 18 01/25/2013 2335   ALKPHOS 44 01/25/2013 2335   BILITOT 0.5 01/25/2013 2335   GFRNONAA 82 (L) 01/25/2013 2335   GFRAA >90 01/25/2013 2335    No results for input(s): NA, K, CL, CO2, GLUCOSE, BUN, CREATININE, CALCIUM, MG, PHOS in the last 8760 hours. Liver Function Tests: No results for input(s): AST, ALT, ALKPHOS, BILITOT, PROT, ALBUMIN in the last 8760 hours. No results for input(s): LIPASE, AMYLASE in the last 8760 hours. No results for  input(s): AMMONIA in the last 8760 hours. CBC: No results for input(s): WBC, NEUTROABS, HGB, HCT, MCV, PLT in the last 8760 hours. Lipid No results for input(s): CHOL, HDL, LDLCALC, TRIG in the last 8760 hours.  Cardiac Enzymes: No results for input(s): CKTOTAL, CKMB, CKMBINDEX, TROPONINI in the last 8760 hours. BNP: No results for input(s): BNP in the last 8760 hours. No results found for: MICROALBUR No results found for: HGBA1C No results found for: TSH No results found for: VITAMINB12 No results found for: FOLATE No results found for: IRON, TIBC, FERRITIN  Imaging and Procedures obtained prior to SNF admission: No results found.   Not all labs, radiology exams or other studies done during hospitalization come through on my EPIC note; however they are reviewed by me.    Assessment and Plan  Cervical spondylosis with myelopathy/cervical spondylosis with radiculopathy- status post anterior fusion of cervical spine on 12/26/2017 and status post posterior C4-6 laminectomy and PSF on 01/07/2018 me: There were no complications, prior to surgery there were complications with constipation in bed patient finally had a bowel movement after surgery and bowels have been regular since SNF -admitted for OT/PT; have scheduled patient's pain medicine, have made oxycodone 5 mg every 4 hours scheduled with an additional Percocet 5/325 every 4 hours as needed, and have scheduled patient's Flexeril 5 mg 3 times daily as well; patient is being prophylaxed with ASA 81 mg daily  Hypertension SNF -continue Norvasc 5 mg daily and Vasotec 20 mg daily  Peripheral neuropathy SNF -controlled; continue Neurontin 600 mg 3 times daily  BPH SNF -no complaints; continue with Myrbetriq 50 mg nightly and Flomax 0.4 mg daily  Hyperlipidemia SNF -not stated as uncontrolled; continue Zocor 20 mg nightly and omega-3 fatty acids/fish oil [phone] Daily    Rj Pedrosa D. Sheppard Coil, MD

## 2018-01-13 ENCOUNTER — Encounter: Payer: Self-pay | Admitting: Internal Medicine

## 2018-01-14 DIAGNOSIS — I10 Essential (primary) hypertension: Secondary | ICD-10-CM | POA: Insufficient documentation

## 2018-01-14 DIAGNOSIS — E785 Hyperlipidemia, unspecified: Secondary | ICD-10-CM | POA: Insufficient documentation

## 2018-01-14 DIAGNOSIS — N400 Enlarged prostate without lower urinary tract symptoms: Secondary | ICD-10-CM | POA: Insufficient documentation

## 2018-02-08 ENCOUNTER — Encounter: Payer: Self-pay | Admitting: Internal Medicine

## 2018-02-08 ENCOUNTER — Non-Acute Institutional Stay (SKILLED_NURSING_FACILITY): Payer: Medicare Other | Admitting: Internal Medicine

## 2018-02-08 DIAGNOSIS — G629 Polyneuropathy, unspecified: Secondary | ICD-10-CM | POA: Diagnosis not present

## 2018-02-08 DIAGNOSIS — I10 Essential (primary) hypertension: Secondary | ICD-10-CM | POA: Diagnosis not present

## 2018-02-08 DIAGNOSIS — N400 Enlarged prostate without lower urinary tract symptoms: Secondary | ICD-10-CM

## 2018-02-08 NOTE — Progress Notes (Signed)
Location:  [place] Room Number: 578I Place of Service:  SNF (31)  Noah Delaine. Sheppard Coil, MD  Patient Care Team: Iva Lento, PA-C as PCP - General (Internal Medicine) Zonia Kief, MD as Consulting Physician (Rehabilitation) Berle Mull, MD as Consulting Physician [facility])  Extended Emergency Contact Information Primary Emergency Contact: Loni [place] Phone: [phone] Relation: Son    Allergies: Patient has no known allergies.  Chief Complaint  Patient presents with  . Medical Management of Chronic Issues    Routine Visit  . Health Maintenance    Influenza vacc.    HPI: Patient is 82 y.o. male who is being seen for routine issues of peripheral neuropathy, hypertension, and prostate enlargement.  Past Medical History:  Diagnosis Date  . Foot drop, bilateral 07/28/2014  . Gait disorder 03/12/2014  . Hereditary and idiopathic peripheral neuropathy 03/05/2014  . Hyperlipidemia   . Hypertension   . Lumbar radiculopathy 03/05/2014  . Neck pain   . Peripheral neuropathy    bilateral feet  . Prostate cancer [facility])    currently has seed implant  . Prostate enlargement   . Spinal stenosis   . Tremor   . Urinary retention     Past Surgical History:  Procedure Laterality Date  . CERVICAL SPINE SURGERY    . KNEE ARTHROSCOPY    . NASAL SINUS SURGERY    . VASECTOMY    . VASOTOMY      Allergies as of 02/08/2018   No Known Allergies     Medication List        Accurate as of 02/08/18 11:59 PM. Always use your most recent med list.          acetaminophen 325 MG tablet Commonly known as:  TYLENOL Take 650 mg by mouth every 6 (six) hours as needed.   amLODipine 5 MG tablet Commonly known as:  NORVASC TK 1 T PO D   aspirin 81 MG tablet Take 81 mg by mouth daily.   enalapril 20 MG tablet Commonly known as:  VASOTEC Take 20 mg by mouth daily.   FINACEA 15 % cream Generic  drug:  Azelaic Acid Apply topically 2 (two) times daily. After skin is thoroughly washed and patted dry, gently but thoroughly massage a thin film of azelaic acid cream into the affected area twice daily, in the morning and evening.   FISH OIL PO Take by mouth.   fluticasone 50 MCG/ACT nasal spray Commonly known as:  FLONASE Place into both nostrils daily.   gabapentin 300 MG capsule Commonly known as:  NEURONTIN Take 600 mg by mouth 3 (three) times daily.   multivitamin capsule Take 1 capsule by mouth daily.   MYRBETRIQ 50 MG Tb24 tablet Generic drug:  mirabegron ER Take 50 mg by mouth daily.   omeprazole 20 MG capsule Commonly known as:  PRILOSEC Take 1 capsule (20 mg total) by mouth daily.   oxyCODONE-acetaminophen 5-325 MG tablet Commonly known as:  PERCOCET/ROXICET Take 1 tablet by mouth 2 (two) times daily as needed for severe pain.   polyethylene glycol packet Commonly known as:  MIRALAX / GLYCOLAX Take 17 g by mouth daily as needed.   primidone 250 MG tablet Commonly known as:  MYSOLINE TAKE 1 TABLET(250 MG) BY MOUTH TWICE DAILY   simvastatin 20 MG tablet Commonly known as:  ZOCOR Take 20 mg by mouth daily.   STOOL SOFTENER PO Take 100 mg by mouth daily  as needed.   tamsulosin 0.4 MG Caps capsule Commonly known as:  FLOMAX Take 0.4 mg by mouth.   VITAMIN B 12 PO Take 1,000 mg by mouth.   vitamin C 1000 MG tablet Take 1,000 mg by mouth daily.   VITAMIN D3 PO Take 1,000 Units by mouth daily.       No orders of the defined types were placed in this encounter.    There is no immunization history on file for this patient.  Social History   Tobacco Use  . Smoking status: Never Smoker  . Smokeless tobacco: Never Used  Substance Use Topics  . Alcohol use: Yes    Alcohol/week: 5.0 - 6.0 standard drinks    Types: 5 - 6 Standard drinks or equivalent per week    Comment: occassionally    Review of Systems  DATA OBTAINED: from  patient GENERAL:  no fevers, fatigue, appetite changes SKIN: No itching, rash HEENT: No complaint RESPIRATORY: No cough, wheezing, SOB CARDIAC: No chest pain, palpitations, lower extremity edema  GI: No abdominal pain, No N/V/D or constipation, No heartburn or reflux  GU: No dysuria, frequency or urgency, or incontinence  MUSCULOSKELETAL: No unrelieved bone/joint pain NEUROLOGIC: No headache, dizziness  PSYCHIATRIC: No overt anxiety or sadness  Vitals:   02/08/18 1208  BP: 140/77  Pulse: (!) 52  Resp: 18  Temp: 97.6 F (36.4 C)   Body mass index is 35.09 kg/m. Physical Exam  GENERAL APPEARANCE: Alert, conversant, No acute distress: Soft neck collar SKIN: No diaphoresis rash HEENT: Unremarkable RESPIRATORY: Breathing is even, unlabored. Lung sounds are clear   CARDIOVASCULAR: Heart RRR/6 systolic murmur,  rubs or gallops. No peripheral edema  GASTROINTESTINAL: Abdomen is soft, non-tender, not distended w/ normal bowel sounds.  GENITOURINARY: Bladder non tender, not distended  MUSCULOSKELETAL: No abnormal joints or musculature NEUROLOGIC: Cranial nerves 2-12 grossly intact. Moves all extremities PSYCHIATRIC: Mood and affect appropriate to situation, no behavioral issues  Patient Active Problem List   Diagnosis Date Noted  . Hypertension 01/14/2018  . Prostate enlargement 01/14/2018  . Hyperlipidemia 01/14/2018  . Cervical spondylosis with radiculopathy 01/08/2018  . Cervical spondylosis with myelopathy 01/08/2018  . Foot drop, bilateral 07/28/2014  . Tremor 07/28/2014  . Gait disorder 03/12/2014  . Peripheral neuropathy 03/05/2014  . Lumbar radiculopathy 03/05/2014    CMP     Component Value Date/Time   NA 138 01/25/2013 2335   K 3.9 01/25/2013 2335   CL 101 01/25/2013 2335   CO2 25 01/25/2013 2335   GLUCOSE 109 (H) 01/25/2013 2335   BUN 17 01/25/2013 2335   CREATININE 0.90 01/25/2013 2335   CALCIUM 9.3 01/25/2013 2335   PROT 6.2 03/12/2014 0948   ALBUMIN  3.9 01/25/2013 2335   AST 20 01/25/2013 2335   ALT 18 01/25/2013 2335   ALKPHOS 44 01/25/2013 2335   BILITOT 0.5 01/25/2013 2335   GFRNONAA 82 (L) 01/25/2013 2335   GFRAA >90 01/25/2013 2335   No results for input(s): NA, K, CL, CO2, GLUCOSE, BUN, CREATININE, CALCIUM, MG, PHOS in the last 8760 hours. No results for input(s): AST, ALT, ALKPHOS, BILITOT, PROT, ALBUMIN in the last 8760 hours. No results for input(s): WBC, NEUTROABS, HGB, HCT, MCV, PLT in the last 8760 hours. No results for input(s): CHOL, LDLCALC, TRIG in the last 8760 hours.  Invalid input(s): HCL No results found for: MICROALBUR No results found for: TSH No results found for: HGBA1C No results found for: CHOL, HDL, LDLCALC, LDLDIRECT, TRIG, CHOLHDL  Significant  Diagnostic Results in last 30 days:  No results found.  Assessment and Plan  Peripheral neuropathy Appears controlled; continue Neurontin 600 mg p.o. 3 times daily  Hypertension Controlled; continue Norvasc 5 g daily and take 20 mg daily  Prostate enlargement With overactive bladder; continue Myrbetriq 24-hour 50 mg p.o. daily    Julann Mcgilvray D. Sheppard Coil, MD

## 2018-02-18 ENCOUNTER — Non-Acute Institutional Stay (SKILLED_NURSING_FACILITY): Payer: Medicare Other | Admitting: Internal Medicine

## 2018-02-18 ENCOUNTER — Encounter: Payer: Self-pay | Admitting: Internal Medicine

## 2018-02-18 DIAGNOSIS — N400 Enlarged prostate without lower urinary tract symptoms: Secondary | ICD-10-CM

## 2018-02-18 DIAGNOSIS — I10 Essential (primary) hypertension: Secondary | ICD-10-CM

## 2018-02-18 DIAGNOSIS — M4712 Other spondylosis with myelopathy, cervical region: Secondary | ICD-10-CM | POA: Diagnosis not present

## 2018-02-18 DIAGNOSIS — E785 Hyperlipidemia, unspecified: Secondary | ICD-10-CM

## 2018-02-18 DIAGNOSIS — G629 Polyneuropathy, unspecified: Secondary | ICD-10-CM

## 2018-02-18 DIAGNOSIS — M4722 Other spondylosis with radiculopathy, cervical region: Secondary | ICD-10-CM | POA: Diagnosis not present

## 2018-02-18 NOTE — Progress Notes (Signed)
Location:  [place] Room Number: [place] of Service:  SNF [phone]) SNF Provider: Inocencio Homes MD  PCP: Iva Lento, PA-C Patient Care Team: Iva Lento, PA-C as PCP - General (Internal Medicine) Zonia Kief, MD as Consulting Physician (Rehabilitation) Berle Mull, MD as Consulting Physician [facility])  Extended Emergency Contact Information Primary Emergency Contact: [place] Phone: [phone] Relation: Son  No Known Allergies  Chief Complaint  Patient presents with  . Discharge Note    Discharge from [facility]    HPI:  82 y.o. male who underwent a C4-5 and C5-6 anterior cervical discectomy and fusion with plate on 07/27/252 who complained of worsening neck pain after surgery and was to undergo a posterior C4-6 laminectomy.  Patient was admitted to skilled nursing facility for OT PT between procedures but because of reports of no bowel movements for 20 days patient ultimately went back to [facility] and was admitted there from 9/1-5 4 severe constipation.  Initially surgeons did not want to operate on the patient until he had a bowel movement but since he did not have one they did a C4-6 laminectomy and PSF on patient with no apparent complications.  Patient was admitted again to skilled nursing facility for OT/PT and is now ready to be discharged to home.    Past Medical History:  Diagnosis Date  . Foot drop, bilateral 07/28/2014  . Gait disorder 03/12/2014  . Hereditary and idiopathic peripheral neuropathy 03/05/2014  . Hyperlipidemia   . Hypertension   . Lumbar radiculopathy 03/05/2014  . Neck pain   . Peripheral neuropathy    bilateral feet  . Prostate cancer [facility])    currently has seed implant  . Prostate enlargement   . Spinal stenosis   . Tremor   . Urinary retention     Past Surgical History:  Procedure Laterality Date  . CERVICAL SPINE  SURGERY    . KNEE ARTHROSCOPY    . NASAL SINUS SURGERY    . VASECTOMY    . VASOTOMY       reports that he has never smoked. He has never used smokeless tobacco. He reports that he drinks about 5.0 - 6.0 standard drinks of alcohol per week. He reports that he does not use drugs. Social History   Socioeconomic History  . Marital status: Married    Spouse name: Not on file  . Number of children: Not on file  . Years of education: Not on file  . Highest education level: Not on file  Occupational History  . Not on file  Social Needs  . Financial resource strain: Not on file  . Food insecurity:    Worry: Not on file    Inability: Not on file  . Transportation needs:    Medical: Not on file    Non-medical: Not on file  Tobacco Use  . Smoking status: Never Smoker  . Smokeless tobacco: Never Used  Substance and Sexual Activity  . Alcohol use: Yes    Alcohol/week: 5.0 - 6.0 standard drinks    Types: 5 - 6 Standard drinks or equivalent per week    Comment: occassionally  . Drug use: No  . Sexual activity: Not on file  Lifestyle  . Physical activity:    Days per week: Not on file    Minutes per session: Not on file  . Stress: Not on file  Relationships  . Social connections:  Talks on phone: Not on file    Gets together: Not on file    Attends religious service: Not on file    Active member of club or organization: Not on file    Attends meetings of clubs or organizations: Not on file    Relationship status: Not on file  . Intimate partner violence:    Fear of current or ex partner: Not on file    Emotionally abused: Not on file    Physically abused: Not on file    Forced sexual activity: Not on file  Other Topics Concern  . Not on file  Social History Narrative   Patient is right handed.   Patient drinks very little caffeine.    Pertinent  Health Maintenance Due  Topic Date Due  . INFLUENZA VACCINE  12/06/2017  . PNA vac Low Risk Adult (1 of 2 - PCV13)  03/11/2018 (Originally 02/11/2003)    Medications: Allergies as of 02/18/2018   No Known Allergies     Medication List        Accurate as of 02/18/18 11:59 PM. Always use your most recent med list.          acetaminophen 325 MG tablet Commonly known as:  TYLENOL Take 650 mg by mouth every 6 (six) hours as needed.   amLODipine 5 MG tablet Commonly known as:  NORVASC TK 1 T PO D   aspirin 81 MG tablet Take 81 mg by mouth daily.   enalapril 20 MG tablet Commonly known as:  VASOTEC Take 20 mg by mouth daily.   FINACEA 15 % cream Generic drug:  Azelaic Acid Apply topically 2 (two) times daily. After skin is thoroughly washed and patted dry, gently but thoroughly massage a thin film of azelaic acid cream into the affected area twice daily, in the morning and evening.   FISH OIL PO Take by mouth.   fluticasone 50 MCG/ACT nasal spray Commonly known as:  FLONASE Place into both nostrils daily.   gabapentin 300 MG capsule Commonly known as:  NEURONTIN Take 600 mg by mouth 3 (three) times daily.   mometasone 0.1 % cream Commonly known as:  ELOCON Apply 1 application topically 2 (two) times daily.   multivitamin capsule Take 1 capsule by mouth daily.   MYRBETRIQ 50 MG Tb24 tablet Generic drug:  mirabegron ER Take 50 mg by mouth daily.   omeprazole 20 MG capsule Commonly known as:  PRILOSEC Take 1 capsule (20 mg total) by mouth daily.   oxyCODONE-acetaminophen 5-325 MG tablet Commonly known as:  PERCOCET/ROXICET Take 1 tablet by mouth 2 (two) times daily as needed for severe pain.   polyethylene glycol packet Commonly known as:  MIRALAX / GLYCOLAX Take 17 g by mouth daily as needed.   primidone 250 MG tablet Commonly known as:  MYSOLINE TAKE 1 TABLET(250 MG) BY MOUTH TWICE DAILY   senna-docusate 8.6-50 MG tablet Commonly known as:  Senokot-S Take 2 tablets by mouth 2 (two) times daily.   simvastatin 20 MG tablet Commonly known as:  ZOCOR Take 20 mg by  mouth daily.   tamsulosin 0.4 MG Caps capsule Commonly known as:  FLOMAX Take 0.4 mg by mouth.   VITAMIN B 12 PO Take 1,000 mg by mouth.   vitamin C 1000 MG tablet Take 1,000 mg by mouth daily.   VITAMIN D3 PO Take 1,000 Units by mouth daily.        Vitals:   02/19/18 0905  BP: (!) 143/73  Pulse: 68  Resp: 18  Temp: 98.1 F (36.7 C)  Weight: 237 lb 9.6 oz (107.8 kg)  Height: 5\' 9"  (1.753 m)   Body mass index is 35.09 kg/m.  Physical Exam  GENERAL APPEARANCE: Alert, conversant. No acute distress.  HEENT: Unremarkable. RESPIRATORY: Breathing is even, unlabored. Lung sounds are clear   CARDIOVASCULAR: Heart RRR 3/6 systolic murmur, no  rubs or gallops. No peripheral edema.  GASTROINTESTINAL: Abdomen is soft, non-tender, not distended w/ normal bowel sounds.  NEUROLOGIC: Cranial nerves 2-12 grossly intact. Moves all extremities   Labs reviewed: Basic Metabolic Panel: No results for input(s): NA, K, CL, CO2, GLUCOSE, BUN, CREATININE, CALCIUM, MG, PHOS in the last 8760 hours. No results found for: [facility] Liver Function Tests: No results for input(s): AST, ALT, ALKPHOS, BILITOT, PROT, ALBUMIN in the last 8760 hours. No results for input(s): LIPASE, AMYLASE in the last 8760 hours. No results for input(s): AMMONIA in the last 8760 hours. CBC: No results for input(s): WBC, NEUTROABS, HGB, HCT, MCV, PLT in the last 8760 hours. Lipid No results for input(s): CHOL, HDL, LDLCALC, TRIG in the last 8760 hours. Cardiac Enzymes: No results for input(s): CKTOTAL, CKMB, CKMBINDEX, TROPONINI in the last 8760 hours. BNP: No results for input(s): BNP in the last 8760 hours. CBG: No results for input(s): GLUCAP in the last 8760 hours.  Procedures and Imaging Studies During Stay: No results found.  Assessment/Plan:   Cervical spondylosis with radiculopathy  Cervical spondylosis with myelopathy  Essential hypertension  Hyperlipidemia, unspecified hyperlipidemia  type  Prostate enlargement  Peripheral polyneuropathy   Patient is being discharged with the following home health services: OT/PT   Patient is being discharged with the following durable medical equipment:  none  Patient has been advised to f/u with their PCP in 1-2 weeks to bring them up to date on their rehab stay.  Social services at facility was responsible for arranging this appointment.  Pt was provided with a 30 day supply of prescriptions for medications and refills must be obtained from their PCP.  For controlled substances, a more limited supply may be provided adequate until PCP appointment only.  Medications have been reconciled.  Time spent > 30 min;> 50% of time with patient was spent reviewing records, labs, tests and studies, counseling and developing plan of care  Inocencio Homes, MD

## 2018-02-23 ENCOUNTER — Encounter: Payer: Self-pay | Admitting: Internal Medicine

## 2018-03-07 ENCOUNTER — Encounter: Payer: Self-pay | Admitting: Internal Medicine

## 2018-03-07 NOTE — Assessment & Plan Note (Signed)
Controlled; continue Norvasc 5 g daily and take 20 mg daily

## 2018-03-07 NOTE — Assessment & Plan Note (Signed)
Appears controlled; continue Neurontin 600 mg p.o. 3 times daily

## 2018-03-07 NOTE — Assessment & Plan Note (Signed)
With overactive bladder; continue Myrbetriq 24-hour 50 mg p.o. daily

## 2018-03-09 ENCOUNTER — Encounter (HOSPITAL_BASED_OUTPATIENT_CLINIC_OR_DEPARTMENT_OTHER): Payer: Self-pay | Admitting: *Deleted

## 2018-03-09 ENCOUNTER — Emergency Department (HOSPITAL_BASED_OUTPATIENT_CLINIC_OR_DEPARTMENT_OTHER)
Admission: EM | Admit: 2018-03-09 | Discharge: 2018-03-10 | Disposition: A | Discharge status: Home / Self Care | Payer: Medicare Other | Attending: Emergency Medicine | Admitting: Emergency Medicine

## 2018-03-09 ENCOUNTER — Other Ambulatory Visit: Payer: Self-pay

## 2018-03-09 DIAGNOSIS — S0501XA Injury of conjunctiva and corneal abrasion without foreign body, right eye, initial encounter: Secondary | ICD-10-CM

## 2018-03-09 DIAGNOSIS — Y998 Other external cause status: Secondary | ICD-10-CM | POA: Diagnosis not present

## 2018-03-09 DIAGNOSIS — Y9289 Other specified places as the place of occurrence of the external cause: Secondary | ICD-10-CM | POA: Diagnosis not present

## 2018-03-09 DIAGNOSIS — Z79899 Other long term (current) drug therapy: Secondary | ICD-10-CM | POA: Insufficient documentation

## 2018-03-09 DIAGNOSIS — H1131 Conjunctival hemorrhage, right eye: Secondary | ICD-10-CM | POA: Diagnosis not present

## 2018-03-09 DIAGNOSIS — X58XXXA Exposure to other specified factors, initial encounter: Secondary | ICD-10-CM | POA: Insufficient documentation

## 2018-03-09 DIAGNOSIS — Y9389 Activity, other specified: Secondary | ICD-10-CM | POA: Insufficient documentation

## 2018-03-09 DIAGNOSIS — I10 Essential (primary) hypertension: Secondary | ICD-10-CM | POA: Insufficient documentation

## 2018-03-09 DIAGNOSIS — E785 Hyperlipidemia, unspecified: Secondary | ICD-10-CM | POA: Insufficient documentation

## 2018-03-09 LAB — CBC
HEMATOCRIT: 37 % — AB (ref 39.0–52.0)
HEMOGLOBIN: 11.5 g/dL — AB (ref 13.0–17.0)
MCH: 30 pg (ref 26.0–34.0)
MCHC: 31.1 g/dL (ref 30.0–36.0)
MCV: 96.6 fL (ref 80.0–100.0)
Platelets: 179 10*3/uL (ref 150–400)
RBC: 3.83 MIL/uL — AB (ref 4.22–5.81)
RDW: 12.9 % (ref 11.5–15.5)
WBC: 4.9 10*3/uL (ref 4.0–10.5)
nRBC: 0 % (ref 0.0–0.2)

## 2018-03-09 LAB — TROPONIN I

## 2018-03-09 LAB — BASIC METABOLIC PANEL
ANION GAP: 8 (ref 5–15)
BUN: 23 mg/dL (ref 8–23)
CHLORIDE: 107 mmol/L (ref 98–111)
CO2: 26 mmol/L (ref 22–32)
Calcium: 8.6 mg/dL — ABNORMAL LOW (ref 8.9–10.3)
Creatinine, Ser: 0.88 mg/dL (ref 0.61–1.24)
GFR calc Af Amer: >60 mL/min (ref >60)
GLUCOSE: 97 mg/dL (ref 70–99)
POTASSIUM: 4.3 mmol/L (ref 3.5–5.1)
SODIUM: 141 mmol/L (ref 135–145)

## 2018-03-09 MED ORDER — FLUORESCEIN SODIUM 1 MG OP STRP
1.0000 | ORAL_STRIP | Freq: Once | OPHTHALMIC | Status: AC
  Administered 2018-03-09: 1 via OPHTHALMIC
  Filled 2018-03-09: qty 1

## 2018-03-09 MED ORDER — TETRACAINE HCL 0.5 % OP SOLN
2.0000 [drp] | Freq: Once | OPHTHALMIC | Status: AC
  Administered 2018-03-09: 2 [drp] via OPHTHALMIC
  Filled 2018-03-09: qty 4

## 2018-03-09 MED ORDER — ERYTHROMYCIN 5 MG/GM OP OINT: TOPICAL_OINTMENT | OPHTHALMIC | 0 refills | Status: DC

## 2018-03-09 NOTE — ED Notes (Signed)
Pt reports having high BP x 1 week. Pt took amlodipine and enalapril as told by PCP PTA. Pt denies HA, dizziness, blurred vision, chest pain, SOB.

## 2018-03-09 NOTE — ED Provider Notes (Signed)
TIME SEEN: 11:01 PM  CHIEF COMPLAINT: Hypertension, right eye redness  HPI: Patient is an 82 year old male with history of hypertension, hyperlipidemia who presents to the emergency department with concerns that his blood pressure was elevated at home.  States that he noticed itching to the right eye and then had redness and bruising under the time.  States he was concerned his blood pressure was elevated and checked it and it was in the 170s/90s.  Reports his blood pressure is normally in the 140s/80s.  States he just had his amlodipine increased from 5 mg to 10 mg.  He contacted his doctor's nurse on call who recommended he take an extra enalapril.  States that his blood pressure continued to be elevated and so he came to the emergency department.  States he feels like he has some blurry vision in the right eye.  No vision loss, flashes or floaters.  No headache.  No chest pain or shortness of breath.  No numbness or focal weakness.  Supposed to wear glasses.  No contact use.  No injury to the head or face.  ROS: See HPI Constitutional: no fever  Eyes: no drainage  ENT: no runny nose   Cardiovascular:  no chest pain  Resp: no SOB  GI: no vomiting GU: no dysuria Integumentary: no rash  Allergy: no hives  Musculoskeletal: no leg swelling  Neurological: no slurred speech ROS otherwise negative  PAST MEDICAL HISTORY/PAST SURGICAL HISTORY:  Past Medical History:  Diagnosis Date  . Foot drop, bilateral 07/28/2014  . Gait disorder 03/12/2014  . Hereditary and idiopathic peripheral neuropathy 03/05/2014  . Hyperlipidemia   . Hypertension   . Lumbar radiculopathy 03/05/2014  . Neck pain   . Peripheral neuropathy    bilateral feet  . Prostate cancer [facility])    currently has seed implant  . Prostate enlargement   . Spinal stenosis   . Tremor   . Urinary retention     MEDICATIONS:  Prior to Admission medications   Medication Sig Start Date End Date Taking? Authorizing Provider   oxyCODONE-acetaminophen (PERCOCET/ROXICET) 5-325 MG tablet Take 1 tablet by mouth 2 (two) times daily as needed for severe pain.    Yes [provider]  acetaminophen (TYLENOL) 325 MG tablet Take 650 mg by mouth every 6 (six) hours as needed.    [provider]  amLODipine (NORVASC) 5 MG tablet TK 1 T PO D 07/17/15   [provider]  Ascorbic Acid (VITAMIN C) 1000 MG tablet Take 1,000 mg by mouth daily.    [provider]  aspirin 81 MG tablet Take 81 mg by mouth daily.    [provider]  Azelaic Acid (FINACEA) 15 % cream Apply topically 2 (two) times daily. After skin is thoroughly washed and patted dry, gently but thoroughly massage a thin film of azelaic acid cream into the affected area twice daily, in the morning and evening.    [provider]  Cholecalciferol (VITAMIN D3 PO) Take 1,000 Units by mouth daily.     [provider]  Cyanocobalamin (VITAMIN B 12 PO) Take 1,000 mg by mouth.    [provider]  enalapril (VASOTEC) 20 MG tablet Take 20 mg by mouth daily.    [provider]  fluticasone (FLONASE) 50 MCG/ACT nasal spray Place into both nostrils daily.    [provider]  gabapentin (NEURONTIN) 300 MG capsule Take 600 mg by mouth 3 (three) times daily.     [provider]  mirabegron ER (MYRBETRIQ) 50 MG TB24 tablet Take 50 mg by mouth daily.    [provider]  mometasone (ELOCON) 0.1 % cream Apply 1 application topically 2 (two) times daily.    [provider]  Multiple Vitamin (MULTIVITAMIN) capsule Take 1 capsule by mouth daily.    [provider]  Omega-3 Fatty Acids (FISH OIL PO) Take by mouth.    [provider]  omeprazole (PRILOSEC) 20 MG capsule Take 1 capsule (20 mg total) by mouth daily. 01/26/13   Palumbo, April, MD  polyethylene glycol (MIRALAX / GLYCOLAX) packet Take 17 g by mouth daily as needed.    [provider]  primidone  (MYSOLINE) 250 MG tablet TAKE 1 TABLET(250 MG) BY MOUTH TWICE DAILY 11/12/17   Kathrynn Ducking, MD  senna-docusate (SENOKOT-S) 8.6-50 MG tablet Take 2 tablets by mouth 2 (two) times daily.    [provider]  simvastatin (ZOCOR) 20 MG tablet Take 20 mg by mouth daily.    [provider]  tamsulosin (FLOMAX) 0.4 MG CAPS capsule Take 0.4 mg by mouth.    [provider]    ALLERGIES:  No Known Allergies  SOCIAL HISTORY:  Social History   Tobacco Use  . Smoking status: Never Smoker  . Smokeless tobacco: Never Used  Substance Use Topics  . Alcohol use: Yes    Alcohol/week: 5.0 - 6.0 standard drinks    Types: 5 - 6 Standard drinks or equivalent per week    Comment: occassionally    FAMILY HISTORY: Family History  Problem Relation Age of Onset  . Emphysema Father   . Scoliosis Sister     EXAM: BP (!) 152/88   Pulse 65   Temp 98 F (36.7 C) (Oral)   Resp 18   Ht 5' 9.5" (1.765 m)   Wt 108 kg   SpO2 100%   BMI 34.64 kg/m  CONSTITUTIONAL: Alert and oriented and responds appropriately to questions. Well-appearing; well-nourished, elderly HEAD: Normocephalic EYES: Pupils are equal and reactive bilaterally, extraocular movements intact.  Patient has subconjunctival hemorrhage noted to the right eye that encompasses the entire sclera.  There is some bruising underneath the right eye but no swelling or bony tenderness.  He does have some floor seen uptake at the 8 o'clock position of the iris of the right eye.  Pressure in the right eye is 14 mmHg.  No corneal ulceration seen.  No foreign body.  Funduscopic exam is limited but appears normal. ENT: normal nose; moist mucous membranes NECK: Supple, no meningismus, no nuchal rigidity, no LAD  CARD: RRR; S1 and S2 appreciated; no murmurs, no clicks, no rubs, no gallops RESP: Normal chest excursion without splinting or tachypnea; breath sounds clear and equal bilaterally; no wheezes, no rhonchi, no rales, no  hypoxia or respiratory distress, speaking full sentences ABD/GI: Normal bowel sounds; non-distended; soft, non-tender, no rebound, no guarding, no peritoneal signs, no hepatosplenomegaly BACK:  The back appears normal and is non-tender to palpation, there is no CVA tenderness EXT: Normal ROM in all joints; non-tender to palpation; no edema; normal capillary refill; no cyanosis, no calf tenderness or swelling    SKIN: Normal color for age and race; warm; no rash NEURO: Moves all extremities equally, normal sensation diffusely, normal gait, cranial nerves II through XII intact, normal speech PSYCH: The patient's mood and manner are appropriate. Grooming and personal hygiene are appropriate.  MEDICAL DECISION MAKING: Patient here with complaints of hypertension.  Blood pressure has improved here  and is now at his baseline.  Labs, EKG showed no sign of endorgan damage.  He has no chest pain or shortness of breath.  No focal neurologic deficits.  No headache.  I do not feel he needs head imaging.  Doubt intracranial hemorrhage or stroke.  He does have a subconjunctival hemorrhage and small corneal abrasion.  Will discharge with erythromycin ointment and give ophthalmology follow-up.  No signs of glaucoma today.  Please see nursing notes for visual acuity.  I feel patient is safe to be discharged with close follow-up with his PCP for further blood pressure management.  He is comfortable with this plan.  At this time, I do not feel there is any life-threatening condition present. I have reviewed and discussed all results (EKG, imaging, lab, urine as appropriate) and exam findings with patient/family. I have reviewed nursing notes and appropriate previous records.  I feel the patient is safe to be discharged home without further emergent workup and can continue workup as an outpatient as needed. Discussed usual and customary return precautions. Patient/family verbalize understanding and are comfortable with this  plan.  Outpatient follow-up has been provided if needed. All questions have been answered.      EKG Interpretation  Date/Time:  Saturday March 09 2018 22:33:53 EDT Ventricular Rate:  56 PR Interval:    QRS Duration: 96 QT Interval:  439 QTC Calculation: 424 R Axis:   3 Text Interpretation:  Sinus rhythm Multiple premature complexes, vent & supraven Prolonged PR interval No significant change since last tracing Confirmed by Pryor Curia [id number]) on 03/09/2018 11:01:03 PM         Kiosha Buchan, Delice Bison, DO 03/10/18 3154

## 2018-03-09 NOTE — ED Triage Notes (Signed)
Pt states his blood pressure has been elevated this week. States he saw PCP and had his BP medication increased. Pt had neck surgery August and again in Sept. Since 3pm he has had redness in his right eye and is concerned his BP may be causing it. Denies other Sx such as chest pain, SOB, dizziness etc.

## 2018-05-13 ENCOUNTER — Other Ambulatory Visit: Payer: Self-pay | Admitting: Neurology

## 2018-08-08 ENCOUNTER — Other Ambulatory Visit: Payer: Self-pay | Admitting: Neurology

## 2018-11-06 ENCOUNTER — Other Ambulatory Visit: Payer: Self-pay | Admitting: Neurology

## 2018-12-03 ENCOUNTER — Ambulatory Visit (INDEPENDENT_AMBULATORY_CARE_PROVIDER_SITE_OTHER): Payer: Medicare Other | Admitting: Neurology

## 2018-12-03 ENCOUNTER — Encounter: Payer: Self-pay | Admitting: Neurology

## 2018-12-03 ENCOUNTER — Other Ambulatory Visit: Payer: Self-pay

## 2018-12-03 VITALS — BP 126/73 | HR 74 | Temp 98.0°F | Ht 69.0 in | Wt 255.0 lb

## 2018-12-03 DIAGNOSIS — M4712 Other spondylosis with myelopathy, cervical region: Secondary | ICD-10-CM | POA: Diagnosis not present

## 2018-12-03 DIAGNOSIS — G629 Polyneuropathy, unspecified: Secondary | ICD-10-CM | POA: Diagnosis not present

## 2018-12-03 DIAGNOSIS — R251 Tremor, unspecified: Secondary | ICD-10-CM

## 2018-12-03 DIAGNOSIS — M21371 Foot drop, right foot: Secondary | ICD-10-CM

## 2018-12-03 DIAGNOSIS — R269 Unspecified abnormalities of gait and mobility: Secondary | ICD-10-CM

## 2018-12-03 DIAGNOSIS — M21372 Foot drop, left foot: Secondary | ICD-10-CM

## 2018-12-03 NOTE — Progress Notes (Signed)
Reason for visit: Peripheral neuropathy  Juan Combs is an 83 y.o. male  History of present illness:  Juan Combs is an 83 year old right-handed white male with a history of a severe peripheral neuropathy with some weakness of the hands and bilateral foot drops as well.  He has a gait disturbance associated with this and has a history of an essential tremor, he is on Mysoline.  The patient was found to have a cervical myelopathy 1 year ago, he underwent cervical fusion and decompression at the C4-5 and C5-6 levels, he however continues to have some decline in his balance.  He has had 1 fall that occurred at home, he fell into the couch and did not hurt himself.  The patient denies issues controlling the bowels and bladder, he does not have a lot of discomfort with the peripheral neuropathy.  He feels that the right leg is weaker than the left, and he is having some peripheral edema affecting the right greater than left leg that has been present over the last 5 or 6 months.  The swelling goes down overnight but recurs when he gets up out of bed, he does not use compression stockings.  He has bilateral AFO braces which help his balance, he uses a cane with ambulation.  He does have bilateral shoulder arthritis, left greater than right, he does have some discomfort with this.  He returns for an evaluation.  Past Medical History:  Diagnosis Date  . Foot drop, bilateral 07/28/2014  . Gait disorder 03/12/2014  . Hereditary and idiopathic peripheral neuropathy 03/05/2014  . Hyperlipidemia   . Hypertension   . Lumbar radiculopathy 03/05/2014  . Neck pain   . Peripheral neuropathy    bilateral feet  . Prostate cancer [facility])    currently has seed implant  . Prostate enlargement   . Spinal stenosis   . Tremor   . Urinary retention     Past Surgical History:  Procedure Laterality Date  . CERVICAL SPINE SURGERY    . KNEE ARTHROSCOPY    . NASAL SINUS SURGERY    . VASECTOMY    . VASOTOMY       Family History  Problem Relation Age of Onset  . Emphysema Father   . Scoliosis Sister     Social history:  reports that he has never smoked. He has never used smokeless tobacco. He reports current alcohol use of about 5.0 - 6.0 standard drinks of alcohol per week. He reports that he does not use drugs.   No Known Allergies  Medications:  Prior to Admission medications   Medication Sig Start Date End Date Taking? Authorizing Provider  acetaminophen (TYLENOL) 325 MG tablet Take 650 mg by mouth every 6 (six) hours as needed.    [provider]  amLODipine (NORVASC) 5 MG tablet TK 1 T PO D 07/17/15   [provider]  Ascorbic Acid (VITAMIN C) 1000 MG tablet Take 1,000 mg by mouth daily.    [provider]  aspirin 81 MG tablet Take 81 mg by mouth daily.    [provider]  Azelaic Acid (FINACEA) 15 % cream Apply topically 2 (two) times daily. After skin is thoroughly washed and patted dry, gently but thoroughly massage a thin film of azelaic acid cream into the affected area twice daily, in the morning and evening.    [provider]  Cholecalciferol (VITAMIN D3 PO) Take 1,000 Units by mouth daily.     [provider]  Cyanocobalamin (VITAMIN B 12 PO) Take 1,000 mg by mouth.    [provider]  enalapril (VASOTEC) 20 MG tablet Take 20 mg by mouth daily.    [provider]  erythromycin ophthalmic ointment Place a 1/2 inch ribbon of ointment into the lower eyelid 4 times a day for 5 days 03/09/18   Ward, Delice Bison, DO  fluticasone (FLONASE) 50 MCG/ACT nasal spray Place into both nostrils daily.    [provider]  gabapentin (NEURONTIN) 300 MG capsule Take 600 mg by mouth 3 (three) times daily.     [provider]  mirabegron ER (MYRBETRIQ) 50 MG TB24 tablet Take 50 mg by mouth daily.    [provider]  mometasone (ELOCON) 0.1 % cream Apply 1 application topically 2 (two) times daily.    [provider]  Multiple Vitamin (MULTIVITAMIN) capsule Take 1 capsule by mouth daily.    [provider]  Omega-3 Fatty Acids (FISH OIL PO) Take by mouth.    [provider]  omeprazole (PRILOSEC) 20 MG capsule Take 1 capsule (20 mg total) by mouth daily. 01/26/13   Palumbo, April, MD  oxyCODONE-acetaminophen (PERCOCET/ROXICET) 5-325 MG tablet Take 1 tablet by mouth 2 (two) times daily as needed for severe pain.     [provider]  polyethylene glycol (MIRALAX / GLYCOLAX) packet Take 17 g by mouth daily as needed.    [provider]  primidone (MYSOLINE) 250 MG tablet TAKE 1 TABLET(250 MG) BY MOUTH TWICE DAILY 11/06/18   Kathrynn Ducking, MD  senna-docusate (SENOKOT-S) 8.6-50 MG tablet Take 2 tablets by mouth 2 (two) times daily.    [provider]  simvastatin (ZOCOR) 20 MG tablet Take 20 mg by mouth daily.    [provider]  tamsulosin (FLOMAX) 0.4 MG CAPS capsule Take 0.4 mg by mouth.    [provider]    ROS:  Out of a complete 14 system review of symptoms, the patient complains only of the following symptoms, and all other reviewed systems are negative.  Shoulder pain Walking problem Tremor  Blood pressure 126/73, pulse 74, temperature 98 F (36.7 C), temperature source Temporal, height 5\' 9"  (1.753 m), weight 255 lb (115.7 kg).  Physical Exam  General: The patient is alert and cooperative at the time of the examination.  The patient is moderately obese.  Skin: 3+ edema below the knee is seen on the right, 2+ on the left.   Neurologic Exam  Mental status: The patient is alert and oriented x 3 at the time of the examination. The patient has apparent normal recent and remote memory, with an apparently normal attention span and concentration ability.   Cranial nerves: Facial symmetry is present. Speech is normal, no aphasia or dysarthria is noted. Extraocular movements are full. Visual fields are full.  Motor: The  patient has good strength in all 4 extremities, with exception of some weakness of intrinsic muscles of the hands bilaterally, and bilateral foot drops.  Sensory examination: Soft touch sensation is symmetric on the face, arms, and legs.  Coordination: The patient has good finger-nose-finger and heel-to-shin bilaterally.  Gait and station: The patient has a minimally wide-based gait with use of a cane and AFO braces, tandem gait was not attempted.  Romberg is unsteady.  Reflexes: Deep tendon reflexes are symmetric, but are depressed.   MRI cervical 12/12/17:  IMPRESSION: Severe stenosis at C4-5 is multifactorial. 2 mm anterolisthesis, combined with central protrusion and posterior element hypertrophy  results in cord compression and abnormal cord signal. BILATERAL C5 foraminal narrowing, worse on the LEFT. Facet arthropathy on the LEFT is prominent.  Similar less severe changes at C5-6 with facet arthropathy and LEFT C6 foraminal narrowing, but only mild stenosis, and no abnormal cord signal at this level.  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  Advanced degenerative change C1-2, without compressive pannus.  * MRI scan images were reviewed online. I agree with the written report.   MRI lumbar 12/06/17:  IMPRESSION: Multilevel spondylosis as described.  Moderate stenosis most pronounced at L3-4 and L4-5 multifactorial. Subarticular zone and foraminal zone narrowing noted at multiple levels, rightward predominant at L3-4 and L4-5. See discussion above.    Assessment/Plan:  1.  Severe peripheral neuropathy  2.  Essential tremor  3.  Cervical myelopathy, status post decompression   4.  Peripheral edema  The patient has ongoing issues with walking, this is not a curable problem.  The patient is remaining safe however.  He does have peripheral edema, I have recommended compression stockings.  He will follow-up in 1 year, he will continue the Mysoline.   Greater  than 50% of the visit was spent in counseling and coordination of care.  Face-to-face time with the patient was 20 minutes.   Jill Alexanders MD 12/03/2018 8:24 AM  [facility] [address] [place], [place] [address]  Phone [phone] Fax [phone]

## 2019-03-24 ENCOUNTER — Other Ambulatory Visit: Payer: Self-pay | Admitting: Neurology

## 2019-06-23 ENCOUNTER — Other Ambulatory Visit: Payer: Self-pay | Admitting: Neurology

## 2019-12-04 ENCOUNTER — Ambulatory Visit: Payer: Medicare Other | Admitting: Neurology

## 2019-12-19 ENCOUNTER — Other Ambulatory Visit: Payer: Self-pay | Admitting: Neurology

## 2020-01-07 ENCOUNTER — Telehealth: Payer: Self-pay | Admitting: Neurology

## 2020-01-07 DIAGNOSIS — M21371 Foot drop, right foot: Secondary | ICD-10-CM

## 2020-01-07 NOTE — Telephone Encounter (Signed)
Pt states an AFO broke last night, pt asking for a script for [employer] for a new set.  Pt states he will be near the office this afternoon and would like to know if he could come by this afternoon so he can take to [employer].  Pt states the last script that was written has expired.  Please call

## 2020-01-07 NOTE — Addendum Note (Signed)
Addended by: Kathrynn Ducking on: 01/07/2020 11:17 AM   Modules accepted: Orders

## 2020-01-07 NOTE — Telephone Encounter (Signed)
I called the patient.  The left AFO brace broke, the patient wants to have new AFO braces bilaterally, I will write the prescription.  The telephone number for [employer] is [phone].  The patient wants [place] to fax the prescription to [facility].

## 2020-01-22 NOTE — Progress Notes (Signed)
Orders have been faxed back to [facility]. Faxed to [phone]

## 2020-02-23 ENCOUNTER — Encounter: Payer: Self-pay | Admitting: Neurology

## 2020-02-23 ENCOUNTER — Other Ambulatory Visit: Payer: Self-pay

## 2020-02-23 ENCOUNTER — Ambulatory Visit: Payer: Medicare Other | Admitting: Neurology

## 2020-02-23 VITALS — BP 132/80 | Ht 69.0 in | Wt 250.0 lb

## 2020-02-23 DIAGNOSIS — R251 Tremor, unspecified: Secondary | ICD-10-CM

## 2020-02-23 DIAGNOSIS — G629 Polyneuropathy, unspecified: Secondary | ICD-10-CM

## 2020-02-23 MED ORDER — PRIMIDONE 250 MG PO TABS: ORAL_TABLET | ORAL | 3 refills | Status: DC

## 2020-02-23 NOTE — Progress Notes (Signed)
PATIENT: Juan Combs DOB: [birth year]  REASON FOR VISIT: follow up HISTORY FROM: patient  HISTORY OF PRESENT ILLNESS: Today 02/23/20 Juan Combs is an 84 year old male with history of severe peripheral neuropathy, some weakness of the hands and bilateral foot drops, is associated with gait disturbance.  He has essential tremor, on Mysoline.  Has history of cervical myelopathy, had cervical fusion and decompression 2 years ago, but his balance still declines.  He has bilateral AFOs.  No recent falls, he is very careful.  Uses walker at home, not wearing AFOs.  Uses quad cane when he goes out.  He has arthritis to the left shoulder.  He is a newlywed, remarried a few weeks ago.  He remains active, is working, drives a motor home, will go to [place] in December for the winter.  He does squats every day, rides his recumbent bike 5 miles a couple times a week.  He has no pain in the legs, just numbness.  He presents today for evaluation unaccompanied.  HISTORY 12/03/2018 Dr. Jannifer Franklin: Juan Combs is an 84 year old right-handed white male with a history of a severe peripheral neuropathy with some weakness of the hands and bilateral foot drops as well.  He has a gait disturbance associated with this and has a history of an essential tremor, he is on Mysoline.  The patient was found to have a cervical myelopathy 1 year ago, he underwent cervical fusion and decompression at the C4-5 and C5-6 levels, he however continues to have some decline in his balance.  He has had 1 fall that occurred at home, he fell into the couch and did not hurt himself.  The patient denies issues controlling the bowels and bladder, he does not have a lot of discomfort with the peripheral neuropathy.  He feels that the right leg is weaker than the left, and he is having some peripheral edema affecting the right greater than left leg that has been present over the last 5 or 6 months.  The swelling goes down overnight but recurs when he gets  up out of bed, he does not use compression stockings.  He has bilateral AFO braces which help his balance, he uses a cane with ambulation.  He does have bilateral shoulder arthritis, left greater than right, he does have some discomfort with this.  He returns for an evaluation.   REVIEW OF SYSTEMS: Out of a complete 14 system review of symptoms, the patient complains only of the following symptoms, and all other reviewed systems are negative.  Walking difficulty, tremor  ALLERGIES: No Known Allergies  HOME MEDICATIONS: Outpatient Medications Prior to Visit  Medication Sig Dispense Refill  . acetaminophen (TYLENOL) 325 MG tablet Take 650 mg by mouth every 6 (six) hours as needed.    Marland Kitchen amLODipine (NORVASC) 5 MG tablet TK 1 T PO D    . Ascorbic Acid (VITAMIN C) 1000 MG tablet Take 1,000 mg by mouth daily.    Marland Kitchen aspirin 81 MG tablet Take 81 mg by mouth daily.    . Azelaic Acid (FINACEA) 15 % cream Apply topically 2 (two) times daily. After skin is thoroughly washed and patted dry, gently but thoroughly massage a thin film of azelaic acid cream into the affected area twice daily, in the morning and evening.    . Cholecalciferol (VITAMIN D3 PO) Take 1,000 Units by mouth daily.     . Cyanocobalamin (VITAMIN B 12 PO) Take 1,000 mg by mouth.    . enalapril (VASOTEC)  20 MG tablet Take 20 mg by mouth daily.    . fluticasone (FLONASE) 50 MCG/ACT nasal spray Place into both nostrils daily.    Marland Kitchen gabapentin (NEURONTIN) 300 MG capsule Take 600 mg by mouth 3 (three) times daily.     . meloxicam (MOBIC) 15 MG tablet Taking 1 every other day    . mirabegron ER (MYRBETRIQ) 50 MG TB24 tablet Take 50 mg by mouth daily.    . mometasone (ELOCON) 0.1 % cream Apply 1 application topically 2 (two) times daily.    . Multiple Vitamin (MULTIVITAMIN) capsule Take 1 capsule by mouth daily.    . Omega-3 Fatty Acids (FISH OIL PO) Take by mouth.    Marland Kitchen omeprazole (PRILOSEC) 20 MG capsule Take 1 capsule (20 mg total) by mouth  daily. 30 capsule 0  . polyethylene glycol (MIRALAX / GLYCOLAX) packet Take 17 g by mouth daily as needed.    . senna-docusate (SENOKOT-S) 8.6-50 MG tablet Take 2 tablets by mouth 2 (two) times daily.    . simvastatin (ZOCOR) 20 MG tablet Take 20 mg by mouth daily.    . tamsulosin (FLOMAX) 0.4 MG CAPS capsule Take 0.4 mg by mouth.    . traMADol (ULTRAM) 50 MG tablet TK 1 T PO Q 8 H PRN    . erythromycin ophthalmic ointment Place a 1/2 inch ribbon of ointment into the lower eyelid 4 times a day for 5 days 3.5 g 0  . primidone (MYSOLINE) 250 MG tablet TAKE 1 TABLET(250 MG) BY MOUTH TWICE DAILY 180 tablet 1   No facility-administered medications prior to visit.    PAST MEDICAL HISTORY: Past Medical History:  Diagnosis Date  . Foot drop, bilateral 07/28/2014  . Gait disorder 03/12/2014  . Hereditary and idiopathic peripheral neuropathy 03/05/2014  . Hyperlipidemia   . Hypertension   . Lumbar radiculopathy 03/05/2014  . Neck pain   . Peripheral neuropathy    bilateral feet  . Prostate cancer [facility])    currently has seed implant  . Prostate enlargement   . Spinal stenosis   . Tremor   . Urinary retention     PAST SURGICAL HISTORY: Past Surgical History:  Procedure Laterality Date  . CERVICAL SPINE SURGERY    . KNEE ARTHROSCOPY    . NASAL SINUS SURGERY    . VASECTOMY    . VASOTOMY      FAMILY HISTORY: Family History  Problem Relation Age of Onset  . Emphysema Father   . Scoliosis Sister     SOCIAL HISTORY: Social History   Socioeconomic History  . Marital status: Married    Spouse name: Not on file  . Number of children: Not on file  . Years of education: Not on file  . Highest education level: Not on file  Occupational History  . Not on file  Tobacco Use  . Smoking status: Never Smoker  . Smokeless tobacco: Never Used  Substance and Sexual Activity  . Alcohol use: Yes    Alcohol/week: 5.0 - 6.0 standard drinks    Types: 5 - 6 Standard drinks or equivalent per  week    Comment: occassionally  . Drug use: No  . Sexual activity: Not on file  Other Topics Concern  . Not on file  Social History Narrative   Patient is right handed.   Patient drinks very little caffeine.   Social Determinants of Health   Financial Resource Strain:   . Difficulty of Paying Living Expenses: Not on file  Food Insecurity:   . Worried About Charity fundraiser in the Last Year: Not on file  . Ran Out of Food in the Last Year: Not on file  Transportation Needs:   . Lack of Transportation (Medical): Not on file  . Lack of Transportation (Non-Medical): Not on file  Physical Activity:   . Days of Exercise per Week: Not on file  . Minutes of Exercise per Session: Not on file  Stress:   . Feeling of Stress : Not on file  Social Connections:   . Frequency of Communication with Friends and Family: Not on file  . Frequency of Social Gatherings with Friends and Family: Not on file  . Attends Religious Services: Not on file  . Active Member of Clubs or Organizations: Not on file  . Attends Archivist Meetings: Not on file  . Marital Status: Not on file  Intimate Partner Violence:   . Fear of Current or Ex-Partner: Not on file  . Emotionally Abused: Not on file  . Physically Abused: Not on file  . Sexually Abused: Not on file   PHYSICAL EXAM  Vitals:   02/23/20 1512  BP: 132/80  Weight: 250 lb (113.4 kg)  Height: 5\' 9"  (1.753 m)   Body mass index is 36.92 kg/m.  Generalized: Well developed, in no acute distress   Neurological examination  Mentation: Alert oriented to time, place, history taking. Follows all commands speech and language fluent Cranial nerve II-XII: Pupils were equal round reactive to light. Extraocular movements were full, visual field were full on confrontational test. Facial sensation and strength were normal.  Head turning and shoulder shrug  were normal and symmetric. Motor: Good strength all extremities. Sensory: Sensory  testing is intact to soft touch on all 4 extremities. No evidence of extinction is noted.  Coordination: Cerebellar testing reveals good finger-nose-finger and heel-to-shin bilaterally.  Mild intention tremor with finger-nose-finger bilaterally.  Handwriting sample, reveals mild tremor translated to handwriting, spiral circle drawing is intact Gait and station: Gait is slightly wide-based, with use of cane, AFO braces.  Tandem gait was not attempted. Reflexes: Deep tendon reflexes are symmetric but depressed throughout.  DIAGNOSTIC DATA (LABS, IMAGING, TESTING) - I reviewed patient records, labs, notes, testing and imaging myself where available.  Lab Results  Component Value Date   WBC 4.9 03/09/2018   HGB 11.5 (L) 03/09/2018   HCT 37.0 (L) 03/09/2018   MCV 96.6 03/09/2018   PLT 179 03/09/2018      Component Value Date/Time   NA 141 03/09/2018 2240   K 4.3 03/09/2018 2240   CL 107 03/09/2018 2240   CO2 26 03/09/2018 2240   GLUCOSE 97 03/09/2018 2240   BUN 23 03/09/2018 2240   CREATININE 0.88 03/09/2018 2240   CALCIUM 8.6 (L) 03/09/2018 2240   PROT 6.2 03/12/2014 0948   ALBUMIN 3.9 01/25/2013 2335   AST 20 01/25/2013 2335   ALT 18 01/25/2013 2335   ALKPHOS 44 01/25/2013 2335   BILITOT 0.5 01/25/2013 2335   GFRNONAA >60 03/09/2018 2240   GFRAA >60 03/09/2018 2240   No results found for: CHOL, HDL, LDLCALC, LDLDIRECT, TRIG, CHOLHDL No results found for: HGBA1C No results found for: VITAMINB12 No results found for: TSH  ASSESSMENT AND PLAN 84 y.o. year old male  has a past medical history of Foot drop, bilateral (07/28/2014), Gait disorder (03/12/2014), Hereditary and idiopathic peripheral neuropathy (03/05/2014), Hyperlipidemia, Hypertension, Lumbar radiculopathy (03/05/2014), Neck pain, Peripheral neuropathy, Prostate cancer ([place]), Prostate enlargement,  Spinal stenosis, Tremor, and Urinary retention. here with:  1.  Severe peripheral neuropathy 2.  Essential tremor 3.   Cervical myelopathy, status post decompression  -Remains overall stable, no recent falls -Continue Mysoline 250 mg twice a day for tremor -Have PCP check routine blood work  -Follow-up in 1 year or sooner if needed  I spent 20 minutes of face-to-face and non-face-to-face time with patient.  This included previsit chart review, lab review, study review, order entry, electronic health record documentation, patient education.  Butler Denmark, AGNP-C, DNP 02/23/2020, 3:41 PM [facility] [address] [place], [place] [address] [phone]

## 2020-02-23 NOTE — Patient Instructions (Addendum)
Continue current medications  Have primary doctor check blood levels at next visit  Be careful not to fall, use your safety devices See you back in 1 year or sooner if needed

## 2020-02-24 NOTE — Progress Notes (Signed)
I have read the note, and I agree with the clinical assessment and plan.  Durand Wittmeyer K Rasean Joos   

## 2020-06-14 ENCOUNTER — Telehealth: Payer: Self-pay | Admitting: Neurology

## 2020-06-14 NOTE — Telephone Encounter (Signed)
I called pt and he is having an issue not really sure is related to neuropathy but is asking.  We see for neuropathy, is on primidone by [place], takes gabapentin and tramadol for pain by other MD (neck surgery). He is wintering in [place].  Got COVID 05-19-20, then casivivimir ideddimab (regeron antibody) on 06-05-20.  Is doing ok.  Noted pain in R thigh (top R center) when walks and certain positions when he sits. No falls, uses cane.  He questions could be related to covid/treatment?  Has appt with urgetn care tomorrow for evaluation and call into Dr. Eugenio Hoes (who did neck surg).  Asking if could be related? I told that hard to address long distance and good that he has appt tomorrow for evalaution, but would be glad to ask and get back with him.

## 2020-06-14 NOTE — Telephone Encounter (Signed)
Pt called, right leg having neuropathy with swelling in right leg upper thigh. Hard to walk 300 ft. When sitting pain is dull. Would like a call from the nurse.

## 2020-06-14 NOTE — Telephone Encounter (Signed)
I called him back and relayed that per SS/NP not sure if neuropathy glad he is seeing urgent care.  regeneron antibody was 4 injections (2 in arms and 2 in stomach).  He appreciated call back.

## 2020-06-14 NOTE — Telephone Encounter (Signed)
Not sure if this is related to neuropathy, I am not familiar with the particular medication he received. Quick search of [place] shows not currently authorized any [place]. Was this medication given IV or injection cause of pain?  If it is more painful, agree good idea to see urgent care, make sure nothing else going on.  At last visit, he denied pain in the legs, but he had numbness from the neuropathy.

## 2020-07-09 ENCOUNTER — Other Ambulatory Visit: Payer: Self-pay | Admitting: Rehabilitation

## 2020-07-09 DIAGNOSIS — M5416 Radiculopathy, lumbar region: Secondary | ICD-10-CM

## 2020-07-13 ENCOUNTER — Ambulatory Visit
Admission: RE | Admit: 2020-07-13 | Discharge: 2020-07-13 | Disposition: A | Discharge status: Home / Self Care | Payer: Medicare Other | Source: Ambulatory Visit | Attending: Rehabilitation | Admitting: Rehabilitation

## 2020-07-13 ENCOUNTER — Other Ambulatory Visit: Payer: Self-pay

## 2020-07-13 DIAGNOSIS — M5416 Radiculopathy, lumbar region: Secondary | ICD-10-CM

## 2020-07-31 ENCOUNTER — Other Ambulatory Visit: Payer: Medicare Other

## 2020-08-23 ENCOUNTER — Other Ambulatory Visit: Payer: Self-pay

## 2020-08-23 ENCOUNTER — Encounter (INDEPENDENT_AMBULATORY_CARE_PROVIDER_SITE_OTHER): Payer: Self-pay | Admitting: Otolaryngology

## 2020-08-23 ENCOUNTER — Ambulatory Visit (INDEPENDENT_AMBULATORY_CARE_PROVIDER_SITE_OTHER): Payer: Medicare Other | Admitting: Otolaryngology

## 2020-08-23 VITALS — Temp 97.3°F

## 2020-08-23 DIAGNOSIS — D490 Neoplasm of unspecified behavior of digestive system: Secondary | ICD-10-CM | POA: Diagnosis not present

## 2020-08-23 NOTE — Progress Notes (Signed)
HPI: Juan Combs is a 85 y.o. 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  Patient had seen a [facility] in [facility] who had recommended biopsy and tonsillectomy because of an enlarged left tonsil and new adenopathy in the left neck.  Patient is scheduled for surgery later this week.  He presents today with his wife for a second opinion. Apparently patient got COVID this past winter and was seen at [place] where they did a scan because of left neck lymphadenopathy.  He was subsequently referred to [facility] associated with [facility] and was scheduled for biopsy and possible tonsillectomy because of probable tonsil cancer. They brought a disc of the CT scan he had of his neck but I am unable to visualize this in the office today.  Past Medical History:  Diagnosis Date  . Foot drop, bilateral 07/28/2014  . Gait disorder 03/12/2014  . Hereditary and idiopathic peripheral neuropathy 03/05/2014  . Hyperlipidemia   . Hypertension   . Lumbar radiculopathy 03/05/2014  . Neck pain   . Peripheral neuropathy    bilateral feet  . Prostate cancer [facility])    currently has seed implant  . Prostate enlargement   . Spinal stenosis   . Tremor   . Urinary retention    Past Surgical History:  Procedure Laterality Date  . CERVICAL SPINE SURGERY    . KNEE ARTHROSCOPY    . NASAL SINUS SURGERY    . VASECTOMY    . VASOTOMY     Social History   Socioeconomic History  . Marital status: Married    Spouse name: Not on file  . Number of children: Not on file  . Years of education: Not on file  . Highest education level: Not on file  Occupational History  . Not on file  Tobacco Use  . Smoking status: Never Smoker  . Smokeless tobacco: Never Used  Substance and Sexual Activity  . Alcohol use: Yes    Alcohol/week: 5.0 - 6.0 standard drinks    Types: 5 - 6 Standard drinks or equivalent per week    Comment: occassionally  .  Drug use: No  . Sexual activity: Not on file  Other Topics Concern  . Not on file  Social History Narrative   Patient is right handed.   Patient drinks very little caffeine.   Social Determinants of Health   Financial Resource Strain: Not on file  Food Insecurity: Not on file  Transportation Needs: Not on file  Physical Activity: Not on file  Stress: Not on file  Social Connections: Not on file   Family History  Problem Relation Age of Onset  . Emphysema Father   . Scoliosis Sister    No Known Allergies Prior to Admission medications   Medication Sig Start Date End Date Taking? Authorizing Provider  acetaminophen (TYLENOL) 325 MG tablet Take 650 mg by mouth every 6 (six) hours as needed.    [provider]  amLODipine (NORVASC) 5 MG tablet TK 1 T PO D 07/17/15   [provider]  Ascorbic Acid (VITAMIN C) 1000 MG tablet Take 1,000 mg by mouth daily.    [provider]  aspirin 81 MG tablet Take 81 mg by mouth daily.    [provider]  Azelaic Acid (FINACEA) 15 % cream Apply topically 2 (two) times daily. After skin is thoroughly washed and patted dry, gently but thoroughly massage a thin film of azelaic  acid cream into the affected area twice daily, in the morning and evening.    [provider]  Cholecalciferol (VITAMIN D3 PO) Take 1,000 Units by mouth daily.     [provider]  Cyanocobalamin (VITAMIN B 12 PO) Take 1,000 mg by mouth.    [provider]  enalapril (VASOTEC) 20 MG tablet Take 20 mg by mouth daily.    [provider]  fluticasone (FLONASE) 50 MCG/ACT nasal spray Place into both nostrils daily.    [provider]  gabapentin (NEURONTIN) 300 MG capsule Take 600 mg by mouth 3 (three) times daily.     [provider]  meloxicam (MOBIC) 15 MG tablet Taking 1 every other day 10/01/18   [provider]  mirabegron ER (MYRBETRIQ) 50 MG TB24 tablet Take 50 mg by mouth daily.     [provider]  mometasone (ELOCON) 0.1 % cream Apply 1 application topically 2 (two) times daily.    [provider]  Multiple Vitamin (MULTIVITAMIN) capsule Take 1 capsule by mouth daily.    [provider]  Omega-3 Fatty Acids (FISH OIL PO) Take by mouth.    [provider]  omeprazole (PRILOSEC) 20 MG capsule Take 1 capsule (20 mg total) by mouth daily. 01/26/13   Palumbo, April, MD  polyethylene glycol (MIRALAX / GLYCOLAX) packet Take 17 g by mouth daily as needed.    [provider]  primidone (MYSOLINE) 250 MG tablet Take 1 tablet twice daily 02/23/20   Suzzanne Cloud, NP  senna-docusate (SENOKOT-S) 8.6-50 MG tablet Take 2 tablets by mouth 2 (two) times daily.    [provider]  simvastatin (ZOCOR) 20 MG tablet Take 20 mg by mouth daily.    [provider]  tamsulosin (FLOMAX) 0.4 MG CAPS capsule Take 0.4 mg by mouth.    [provider]  traMADol (ULTRAM) 50 MG tablet TK 1 T PO Q 8 H PRN 11/02/18   [provider]     Positive ROS: Otherwise negative  All other systems have been reviewed and were otherwise negative with the exception of those mentioned in the HPI and as above.  Physical Exam: Constitutional: Alert, well-appearing, no acute distress Ears: External ears without lesions or tenderness. Ear canals are clear bilaterally with intact, clear TMs.  Nasal: External nose without lesions. . Clear nasal passages Oral: Lips and gums without lesions. Tongue and palate mucosa without lesions. Posterior oropharynx clear.  The right tonsil appears normal.  The left tonsil is significantly enlarged and very firm to palpation. Neck: On examination of the neck the right side is soft with no palpable adenopathy.  He has enlarged left neck nodes especially enlarged jugulodigastric node consistent with probable metastatic disease. Respiratory: Breathing comfortably  Skin: No facial/neck lesions or rash  noted.  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  Plan: I reviewed with the patient as well as his wife concerning need for biopsy to obtain diagnosis as well as a PET scan for further evaluation of the extent of the disease.  Following this he will probably receive radiation therapy plus minus chemotherapy.  I told them that I agree with the planned therapy and would proceed with presently scheduled surgery.  Radene Journey, MD

## 2020-09-05 ENCOUNTER — Encounter (HOSPITAL_BASED_OUTPATIENT_CLINIC_OR_DEPARTMENT_OTHER): Payer: Self-pay

## 2020-09-05 ENCOUNTER — Other Ambulatory Visit: Payer: Self-pay

## 2020-09-05 ENCOUNTER — Emergency Department (HOSPITAL_BASED_OUTPATIENT_CLINIC_OR_DEPARTMENT_OTHER)
Admission: EM | Admit: 2020-09-05 | Discharge: 2020-09-05 | Disposition: A | Discharge status: Home / Self Care | Payer: Medicare Other | Attending: Emergency Medicine | Admitting: Emergency Medicine

## 2020-09-05 DIAGNOSIS — Z7982 Long term (current) use of aspirin: Secondary | ICD-10-CM | POA: Diagnosis not present

## 2020-09-05 DIAGNOSIS — K5641 Fecal impaction: Secondary | ICD-10-CM

## 2020-09-05 DIAGNOSIS — I10 Essential (primary) hypertension: Secondary | ICD-10-CM | POA: Diagnosis not present

## 2020-09-05 DIAGNOSIS — Z8546 Personal history of malignant neoplasm of prostate: Secondary | ICD-10-CM | POA: Diagnosis not present

## 2020-09-05 DIAGNOSIS — Z79899 Other long term (current) drug therapy: Secondary | ICD-10-CM | POA: Diagnosis not present

## 2020-09-05 DIAGNOSIS — R339 Retention of urine, unspecified: Secondary | ICD-10-CM | POA: Insufficient documentation

## 2020-09-05 MED ORDER — LIDOCAINE HCL URETHRAL/MUCOSAL 2 % EX GEL
1.0000 "application " | Freq: Once | CUTANEOUS | Status: DC
  Filled 2020-09-05: qty 11

## 2020-09-05 MED ORDER — FLEET ENEMA 7-19 GM/118ML RE ENEM
1.0000 | ENEMA | Freq: Once | RECTAL | Status: AC
  Administered 2020-09-05: 1 via RECTAL
  Filled 2020-09-05: qty 1

## 2020-09-05 NOTE — Discharge Instructions (Addendum)
You can use MiraLAX daily, titrate to effect for a soft smooth stool.  For tomorrow to 4 doses of MiraLAX and a single serve Gatorade shake well drink within 1 hour.  In the next day titrate MiraLAX to effect.  Follow-up with primary care for better stool management and return to [place] with concerns

## 2020-09-05 NOTE — ED Notes (Signed)
Witnessed rectal exam/disimpaction by Dr. Ron Parker.

## 2020-09-05 NOTE — ED Notes (Signed)
Chaperoned EDP for disimpaction.

## 2020-09-05 NOTE — ED Notes (Signed)
Pt assisted to [facility] after enema given. Call bell in reach, family with pt. Pt will call after he is finished

## 2020-09-05 NOTE — ED Provider Notes (Signed)
[place] EMERGENCY DEPARTMENT Provider Note   CSN: [id number] Arrival date & time: 09/05/20  1813     History Chief Complaint  Patient presents with  . Constipation  . Urinary Retention    Juan Combs is a 85 y.o. male.  Patient has a history of constipation for which she takes stool softeners.  He is recently had an increase in his pain medication by his other providers.  He thought increasing the stool softeners would help however it is not.  He is getting so constipated that he is not have a bowel movement in several days.  He is not able to urinate as of today due to the bowel blockage.  No fevers chills no nausea vomiting.  No abdominal pain just pressure.   Constipation Associated symptoms: no back pain, no diarrhea, no dysuria, no fever, no nausea and no vomiting        Past Medical History:  Diagnosis Date  . Foot drop, bilateral 07/28/2014  . Gait disorder 03/12/2014  . Hereditary and idiopathic peripheral neuropathy 03/05/2014  . Hyperlipidemia   . Hypertension   . Lumbar radiculopathy 03/05/2014  . Neck pain   . Peripheral neuropathy    bilateral feet  . Prostate cancer [facility])    currently has seed implant  . Prostate enlargement   . Spinal stenosis   . Tremor   . Urinary retention     Patient Active Problem List   Diagnosis Date Noted  . Hypertension 01/14/2018  . Prostate enlargement 01/14/2018  . Hyperlipidemia 01/14/2018  . Cervical spondylosis with radiculopathy 01/08/2018  . Cervical spondylosis with myelopathy 01/08/2018  . Bilateral foot-drop 07/28/2014  . Tremor 07/28/2014  . Gait disorder 03/12/2014  . Peripheral neuropathy 03/05/2014  . Lumbar radiculopathy 03/05/2014    Past Surgical History:  Procedure Laterality Date  . CERVICAL SPINE SURGERY    . KNEE ARTHROSCOPY    . NASAL SINUS SURGERY    . VASECTOMY    . VASOTOMY         Family History  Problem Relation Age of Onset  . Emphysema Father   . Scoliosis  Sister     Social History   Tobacco Use  . Smoking status: Never Smoker  . Smokeless tobacco: Never Used  Substance Use Topics  . Alcohol use: Yes    Alcohol/week: 5.0 - 6.0 standard drinks    Types: 5 - 6 Standard drinks or equivalent per week    Comment: occassionally  . Drug use: No    Home Medications Prior to Admission medications   Medication Sig Start Date End Date Taking? Authorizing Provider  acetaminophen (TYLENOL) 325 MG tablet Take 650 mg by mouth every 6 (six) hours as needed.    [provider]  amLODipine (NORVASC) 5 MG tablet TK 1 T PO D 07/17/15   [provider]  Ascorbic Acid (VITAMIN C) 1000 MG tablet Take 1,000 mg by mouth daily.    [provider]  aspirin 81 MG tablet Take 81 mg by mouth daily.    [provider]  Azelaic Acid (FINACEA) 15 % cream Apply topically 2 (two) times daily. After skin is thoroughly washed and patted dry, gently but thoroughly massage a thin film of azelaic acid cream into the affected area twice daily, in the morning and evening.    [provider]  Cholecalciferol (VITAMIN D3 PO) Take 1,000 Units by mouth daily.     [provider]  Cyanocobalamin (VITAMIN B 12  PO) Take 1,000 mg by mouth.    [provider]  enalapril (VASOTEC) 20 MG tablet Take 20 mg by mouth daily.    [provider]  fluticasone (FLONASE) 50 MCG/ACT nasal spray Place into both nostrils daily.    [provider]  gabapentin (NEURONTIN) 300 MG capsule Take 600 mg by mouth 3 (three) times daily.     [provider]  meloxicam (MOBIC) 15 MG tablet Taking 1 every other day 10/01/18   [provider]  mirabegron ER (MYRBETRIQ) 50 MG TB24 tablet Take 50 mg by mouth daily.    [provider]  mometasone (ELOCON) 0.1 % cream Apply 1 application topically 2 (two) times daily.    [provider]  Multiple Vitamin (MULTIVITAMIN) capsule Take 1 capsule by mouth  daily.    [provider]  Omega-3 Fatty Acids (FISH OIL PO) Take by mouth.    [provider]  omeprazole (PRILOSEC) 20 MG capsule Take 1 capsule (20 mg total) by mouth daily. 01/26/13   Palumbo, April, MD  polyethylene glycol (MIRALAX / GLYCOLAX) packet Take 17 g by mouth daily as needed.    [provider]  primidone (MYSOLINE) 250 MG tablet Take 1 tablet twice daily 02/23/20   Suzzanne Cloud, NP  senna-docusate (SENOKOT-S) 8.6-50 MG tablet Take 2 tablets by mouth 2 (two) times daily.    [provider]  simvastatin (ZOCOR) 20 MG tablet Take 20 mg by mouth daily.    [provider]  tamsulosin (FLOMAX) 0.4 MG CAPS capsule Take 0.4 mg by mouth.    [provider]  traMADol (ULTRAM) 50 MG tablet TK 1 T PO Q 8 H PRN 11/02/18   [provider]    Allergies    Patient has no known allergies.  Review of Systems   Review of Systems  Constitutional: Negative for chills and fever.  HENT: Negative for congestion and rhinorrhea.   Respiratory: Negative for cough and shortness of breath.   Cardiovascular: Negative for chest pain and palpitations.  Gastrointestinal: Positive for abdominal distention and constipation. Negative for diarrhea, nausea and vomiting.  Genitourinary: Negative for difficulty urinating and dysuria.  Musculoskeletal: Negative for arthralgias and back pain.  Skin: Negative for color change and rash.  Neurological: Negative for light-headedness and headaches.    Physical Exam Updated Vital Signs BP (!) 157/105   Pulse 82   Temp 97.9 F (36.6 C) (Oral)   Resp 20   Ht 5\' 9"  (1.753 m)   Wt 109.8 kg   SpO2 95%   BMI 35.74 kg/m   Physical Exam Vitals and nursing note reviewed. Exam conducted with a chaperone present.  Constitutional:      General: He is not in acute distress.    Appearance: Normal appearance.  HENT:     Head: Normocephalic and atraumatic.     Nose: No rhinorrhea.  Eyes:     General:         Right eye: No discharge.        Left eye: No discharge.     Conjunctiva/sclera: Conjunctivae normal.  Cardiovascular:     Rate and Rhythm: Normal rate and regular rhythm.  Pulmonary:     Effort: Pulmonary effort is normal.     Breath sounds: No stridor.  Abdominal:     General: Abdomen is flat. There is no distension.     Palpations: Abdomen is soft.  Genitourinary:    Comments: No rectal tenderness no blood  no melena.  Hard compacted stool in the rectal vault. Musculoskeletal:        General: No deformity or signs of injury.  Skin:    General: Skin is warm and dry.  Neurological:     General: No focal deficit present.     Mental Status: He is alert. Mental status is at baseline.     Motor: No weakness.  Psychiatric:        Mood and Affect: Mood normal.        Behavior: Behavior normal.        Thought Content: Thought content normal.     ED Results / Procedures / Treatments   Labs (all labs ordered are listed, but only abnormal results are displayed) Labs Reviewed  URINALYSIS, ROUTINE W REFLEX MICROSCOPIC    EKG None  Radiology No results found.  Procedures Fecal disimpaction  Date/Time: 09/05/2020 7:18 PM Performed by: Breck Coons, MD Authorized by: Breck Coons, MD  Consent: Verbal consent obtained. Risks and benefits: risks, benefits and alternatives were discussed Consent given by: patient and spouse Patient understanding: patient states understanding of the procedure being performed Patient consent: the patient's understanding of the procedure matches consent given Procedure consent: procedure consent matches procedure scheduled Relevant documents: relevant documents present and verified Imaging studies: imaging studies available Required items: required blood products, implants, devices, and special equipment available Patient identity confirmed: verbally with patient Time out: Immediately prior to procedure a "time out" was called to verify the  correct patient, procedure, equipment, support staff and site/side marked as required. Local anesthesia used: no  Anesthesia: Local anesthesia used: no  Sedation: Patient sedated: no  Patient tolerance: patient tolerated the procedure well with no immediate complications      Medications Ordered in ED Medications  lidocaine (XYLOCAINE) 2 % jelly 1 application (has no administration in time range)  sodium phosphate (FLEET) 7-19 GM/118ML enema 1 enema (1 enema Rectal Given 09/05/20 2035)    ED Course  I have reviewed the triage vital signs and the nursing notes.  Pertinent labs & imaging results that were available during my care of the patient were reviewed by me and considered in my medical decision making (see chart for details).     MDM Rules/Calculators/A&P                          Patient with urinary retention secondary to constipation.  Is only been a few hours.  Not likely have renal dysfunction secondary to this.  I attempted disimpaction got a lot of stool output he now wants to sit on the commode to try and pass the rest of the stool and urinate.  If this fails we will In-N-Out catheterized and then work on more fecal removal.  Patient got in and out catheter 900 cc of urine.  Then had a large bowel movement after disimpaction was completed.  He is feels much better and feels like he can void normally.  Feels comfortable discharge home.  Bowel regimen recommended outpatient follow-up recommended return precautions discussed  Final Clinical Impression(s) / ED Diagnoses Final diagnoses:  Fecal impaction [facility])    Rx / DC Orders ED Discharge Orders    None       Breck Coons, MD 09/06/20 [phone]

## 2020-09-05 NOTE — ED Notes (Signed)
Pt able to pass approx 50ML on commode, small stool.

## 2020-09-05 NOTE — ED Notes (Signed)
ED Provider at bedside. Dr. Ron Parker

## 2020-09-05 NOTE — ED Notes (Signed)
ED Provider at bedside. 

## 2020-09-05 NOTE — ED Triage Notes (Addendum)
Pt c/o constipation since Wednesday. Tried stool softeners, dulcolax, magnesium citrate as well as an enema an hour pta with no results. States also cannot urinate since this morning. Feels the urge to urinate. Currently taking oxycodone.

## 2020-12-23 ENCOUNTER — Telehealth: Payer: Self-pay | Admitting: Neurology

## 2020-12-23 MED ORDER — PRIMIDONE 50 MG PO TABS: 50.0000 mg | ORAL_TABLET | Freq: Two times a day (BID) | ORAL | 3 refills | Status: DC

## 2020-12-23 NOTE — Telephone Encounter (Signed)
Pt would like a call to discuss going up on his primidone (MYSOLINE) 250 MG tablet

## 2020-12-23 NOTE — Telephone Encounter (Signed)
I called the patient.  The patient recently had surgery for throat cancer, since the surgery he has had increased tremor and shakiness of the arms and hands.  He has been on primidone 250 mg twice daily for a number of years, he tolerates the medication quite well.  We will go up slightly on the medication taking 300 mg twice daily.  I will send in a prescription for the 50 mg tablets taking 1 twice daily.  He will need to watch out for any drowsiness.

## 2021-02-23 ENCOUNTER — Ambulatory Visit: Payer: Medicare Other | Admitting: Neurology

## 2021-02-28 ENCOUNTER — Encounter: Payer: Self-pay | Admitting: Neurology

## 2021-02-28 ENCOUNTER — Ambulatory Visit: Payer: Medicare Other | Admitting: Neurology

## 2021-02-28 VITALS — BP 103/56 | HR 54 | Ht 69.0 in | Wt 218.0 lb

## 2021-02-28 DIAGNOSIS — R251 Tremor, unspecified: Secondary | ICD-10-CM

## 2021-02-28 DIAGNOSIS — G629 Polyneuropathy, unspecified: Secondary | ICD-10-CM

## 2021-02-28 DIAGNOSIS — M21372 Foot drop, left foot: Secondary | ICD-10-CM | POA: Diagnosis not present

## 2021-02-28 DIAGNOSIS — M21371 Foot drop, right foot: Secondary | ICD-10-CM | POA: Diagnosis not present

## 2021-02-28 MED ORDER — PRIMIDONE 250 MG PO TABS: ORAL_TABLET | ORAL | 3 refills | Status: DC

## 2021-02-28 NOTE — Progress Notes (Signed)
Reason for visit: Peripheral neuropathy, essential tremor  Juan Combs is an 85 y.o. male  History of present illness:  Juan Combs is an 85 year old right-handed white male with a history of an essential tremor and a relatively severe peripheral neuropathy associated with a gait disorder.  The patient uses bilateral AFO braces and uses a cane for ambulation, he has fallen on occasion.  He feels that the right leg is weaker than the left.  Unfortunately in the last several months he was diagnosed with throat cancer and has received radiation therapy but he now has a nonhealing ulcer at the base of the tongue on the left.  He has a lot of pain associated with this.  He has very little pain with his peripheral neuropathy.  He has cut back on the gabapentin taking 300 mg twice daily in part because he was having difficulty swallowing the medication.  The patient had a fall within the last month while taking oxycodone, this resulted in increased gait instability, he has done better when switching back to the hydrocodone.  He may use a walker inside the house.  He returns for an evaluation.  He has lost weight because of the throat cancer.  Past Medical History:  Diagnosis Date   Foot drop, bilateral 07/28/2014   Gait disorder 03/12/2014   Hereditary and idiopathic peripheral neuropathy 03/05/2014   Hyperlipidemia    Hypertension    Lumbar radiculopathy 03/05/2014   Neck pain    Peripheral neuropathy    bilateral feet   Prostate cancer ([place])    currently has seed implant   Prostate enlargement    Spinal stenosis    Tremor    Urinary retention     Past Surgical History:  Procedure Laterality Date   CERVICAL SPINE SURGERY     KNEE ARTHROSCOPY     NASAL SINUS SURGERY     VASECTOMY     VASOTOMY      Family History  Problem Relation Age of Onset   Emphysema Father    Scoliosis Sister     Social history:  reports that he has never smoked. He has never used smokeless tobacco. He  reports current alcohol use of about 5.0 - 6.0 standard drinks per week. He reports that he does not use drugs.   No Known Allergies  Medications:  Prior to Admission medications   Medication Sig Start Date End Date Taking? Authorizing Provider  acetaminophen (TYLENOL) 325 MG tablet Take 650 mg by mouth every 6 (six) hours as needed.   Yes [provider]  Ascorbic Acid (VITAMIN C) 1000 MG tablet Take 1,000 mg by mouth daily.   Yes [provider]  aspirin 81 MG tablet Take 81 mg by mouth daily.   Yes [provider]  Azelaic Acid 15 % gel Apply topically 2 (two) times daily. After skin is thoroughly washed and patted dry, gently but thoroughly massage a thin film of azelaic acid cream into the affected area twice daily, in the morning and evening.   Yes [provider]  Cholecalciferol (VITAMIN D3 PO) Take 1,000 Units by mouth daily.    Yes [provider]  Cyanocobalamin (VITAMIN B 12 PO) Take 1,000 mg by mouth.   Yes [provider]  enalapril (VASOTEC) 20 MG tablet Take 20 mg by mouth daily.   Yes [provider]  fluticasone (FLONASE) 50 MCG/ACT nasal spray Place into both nostrils daily.   Yes [provider]  gabapentin (  NEURONTIN) 300 MG capsule Take 600 mg by mouth 3 (three) times daily.    Yes [provider]  HYDROcodone-acetaminophen (NORCO) 10-325 MG tablet Take 1 tablet by mouth every 6 (six) hours as needed.   Yes [provider]  magic mouthwash SOLN SWISH AND SWALLOW 30 MLS BY MOUTH THREE TIMES DAILY AS NEEDED 02/17/21  Yes [provider]  meloxicam (MOBIC) 15 MG tablet Taking 1 every other day 10/01/18  Yes [provider]  mirabegron ER (MYRBETRIQ) 50 MG TB24 tablet Take 50 mg by mouth daily.   Yes [provider]  mometasone (ELOCON) 0.1 % cream Apply 1 application topically 2 (two) times daily.   Yes [provider]  Multiple Vitamin (MULTIVITAMIN)  capsule Take 1 capsule by mouth daily.   Yes [provider]  Omega-3 Fatty Acids (FISH OIL PO) Take by mouth.   Yes [provider]  omeprazole (PRILOSEC) 20 MG capsule Take 1 capsule (20 mg total) by mouth daily. 01/26/13  Yes Palumbo, April, MD  polyethylene glycol (MIRALAX / GLYCOLAX) packet Take 17 g by mouth daily as needed.   Yes [provider]  predniSONE (DELTASONE) 20 MG tablet Take by mouth. 02/17/21  Yes [provider]  primidone (MYSOLINE) 250 MG tablet Take 1 tablet twice daily 02/23/20  Yes Suzzanne Cloud, NP  primidone (MYSOLINE) 50 MG tablet Take 1 tablet (50 mg total) by mouth in the morning and at bedtime. 12/23/20  Yes Kathrynn Ducking, MD  senna-docusate (SENOKOT-S) 8.6-50 MG tablet Take 2 tablets by mouth 2 (two) times daily.   Yes [provider]  simvastatin (ZOCOR) 20 MG tablet Take 20 mg by mouth daily.   Yes [provider]  tamsulosin (FLOMAX) 0.4 MG CAPS capsule Take 0.4 mg by mouth.   Yes [provider]  triamcinolone (KENALOG) 0.1 % paste Apply to tongue/oral cavity ulcers twice daily for 21 days 02/08/21  Yes [provider]    ROS:  Out of a complete 14 system review of symptoms, the patient complains only of the following symptoms, and all other reviewed systems are negative.  Weight loss Mouth pain Weakness Walking difficulty Tremor  Blood pressure (!) 103/56, pulse (!) 54, height 5\' 9"  (1.753 m), weight 218 lb (98.9 kg).  Physical Exam  General: The patient is alert and cooperative at the time of the examination.  Skin: 2+ edema below the knees is seen bilaterally.   Neurologic Exam  Mental status: The patient is alert and oriented x 3 at the time of the examination. The patient has apparent normal recent and remote memory, with an apparently normal attention span and concentration ability.   Cranial nerves: Facial symmetry is present. Speech is normal, no aphasia or  dysarthria is noted. Extraocular movements are full. Visual fields are full.  Motor: The patient has good strength in all 4 extremities, with exception of bilateral foot drop and some weakness in intrinsic muscles of the hands bilaterally.  The patient has 4/5 strength with hip flexion on the right, normal strength on the left.  Sensory examination: Soft touch sensation is symmetric on the face, arms, and legs.  Coordination: The patient has good finger-nose-finger and heel-to-shin bilaterally.  The patient has mild tremor with finger-nose-finger bilaterally, no significant tremor was noted when drawing a spiral.  Gait and station: The patient has a slightly wide-based gait, he walks with a cane.  The patient has bilateral AFO braces  Reflexes: Deep tendon reflexes are  symmetric, but are depressed.   Assessment/Plan:  1.  Peripheral neuropathy  2.  Gait disturbance  3.  Essential tremor  The patient really has a fairly minimal essential tremor, he may be able to stop the 50 mg tablets of the primidone and just stay on the 250 mg tablets twice daily.  I have suggested that he eliminate the gabapentin as he is not having any neuropathy pain.  The patient was given a prescription for the 250 mg primidone tablets.  He gets blood work done through his primary care physician.  He we will follow-up here in 1 year.  In the future, he can be followed through Dr. Krista Blue.  Jill Alexanders MD 02/28/2021 10:06 AM  [facility] [address] [place], [place] [address]  Phone [phone] Fax [phone]

## 2021-04-20 ENCOUNTER — Other Ambulatory Visit: Payer: Self-pay | Admitting: *Deleted

## 2021-04-20 MED ORDER — PRIMIDONE 50 MG PO TABS: 50.0000 mg | ORAL_TABLET | Freq: Two times a day (BID) | ORAL | 11 refills | Status: DC

## 2021-05-19 ENCOUNTER — Encounter (HOSPITAL_BASED_OUTPATIENT_CLINIC_OR_DEPARTMENT_OTHER): Payer: Self-pay

## 2021-05-19 ENCOUNTER — Emergency Department (HOSPITAL_BASED_OUTPATIENT_CLINIC_OR_DEPARTMENT_OTHER)
Admission: EM | Admit: 2021-05-19 | Discharge: 2021-05-20 | Disposition: A | Discharge status: Home / Self Care | Payer: Medicare Other | Attending: Emergency Medicine | Admitting: Emergency Medicine

## 2021-05-19 ENCOUNTER — Other Ambulatory Visit: Payer: Self-pay

## 2021-05-19 DIAGNOSIS — I10 Essential (primary) hypertension: Secondary | ICD-10-CM | POA: Insufficient documentation

## 2021-05-19 DIAGNOSIS — Z23 Encounter for immunization: Secondary | ICD-10-CM | POA: Insufficient documentation

## 2021-05-19 DIAGNOSIS — Z8546 Personal history of malignant neoplasm of prostate: Secondary | ICD-10-CM | POA: Insufficient documentation

## 2021-05-19 DIAGNOSIS — Z79899 Other long term (current) drug therapy: Secondary | ICD-10-CM | POA: Diagnosis not present

## 2021-05-19 DIAGNOSIS — G629 Polyneuropathy, unspecified: Secondary | ICD-10-CM | POA: Diagnosis not present

## 2021-05-19 DIAGNOSIS — Y93H2 Activity, gardening and landscaping: Secondary | ICD-10-CM | POA: Insufficient documentation

## 2021-05-19 DIAGNOSIS — Z7982 Long term (current) use of aspirin: Secondary | ICD-10-CM | POA: Insufficient documentation

## 2021-05-19 DIAGNOSIS — W458XXA Other foreign body or object entering through skin, initial encounter: Secondary | ICD-10-CM | POA: Insufficient documentation

## 2021-05-19 DIAGNOSIS — S90932A Unspecified superficial injury of left great toe, initial encounter: Secondary | ICD-10-CM | POA: Diagnosis present

## 2021-05-19 DIAGNOSIS — S91112A Laceration without foreign body of left great toe without damage to nail, initial encounter: Secondary | ICD-10-CM | POA: Insufficient documentation

## 2021-05-19 MED ORDER — "THROMBI-PAD 3""X3"" EX PADS"
1.0000 | MEDICATED_PAD | Freq: Once | CUTANEOUS | Status: AC
  Administered 2021-05-19: 1 via TOPICAL
  Filled 2021-05-19: qty 1

## 2021-05-19 MED ORDER — CEPHALEXIN 500 MG PO CAPS: 500.0000 mg | ORAL_CAPSULE | Freq: Four times a day (QID) | ORAL | 0 refills | Status: DC

## 2021-05-19 MED ORDER — CEPHALEXIN 250 MG PO CAPS
500.0000 mg | ORAL_CAPSULE | Freq: Once | ORAL | Status: AC
  Administered 2021-05-19: 500 mg via ORAL
  Filled 2021-05-19: qty 2

## 2021-05-19 MED ORDER — TETANUS-DIPHTH-ACELL PERTUSSIS 5-2.5-18.5 LF-MCG/0.5 IM SUSY
0.5000 mL | PREFILLED_SYRINGE | Freq: Once | INTRAMUSCULAR | Status: AC
  Administered 2021-05-19: 0.5 mL via INTRAMUSCULAR
  Filled 2021-05-19: qty 0.5

## 2021-05-19 NOTE — ED Triage Notes (Signed)
Pt states he was cutting his toenails and cut his left great toe ~40 min PTA-dsg applied in triage-NAD-to triage in w/c

## 2021-05-19 NOTE — ED Provider Notes (Signed)
[facility] Provider Note: Georgena Spurling, MD, FACEP  CSN: [id number] MRN: [id number] ARRIVAL: 05/19/21 at 2139 ROOM: [place]  Toe Injury   HISTORY OF PRESENT ILLNESS  05/19/21 11:22 PM Juan Combs is a 86 y.o. male who was trimming his toenails about 40 minutes prior to arrival and cut the end of his left great toe in the process.  He rates associated pain as a 2 out of 10.  A dressing was applied in triage.  He is not on anticoagulation.  He does have neuropathy in his feet.   Past Medical History:  Diagnosis Date   Foot drop, bilateral 07/28/2014   Gait disorder 03/12/2014   Hereditary and idiopathic peripheral neuropathy 03/05/2014   Hyperlipidemia    Hypertension    Lumbar radiculopathy 03/05/2014   Neck pain    Peripheral neuropathy    bilateral feet   Prostate cancer ([place])    currently has seed implant   Prostate enlargement    Spinal stenosis    Tremor    Urinary retention     Past Surgical History:  Procedure Laterality Date   CERVICAL SPINE SURGERY     KNEE ARTHROSCOPY     NASAL SINUS SURGERY     VASECTOMY     VASOTOMY      Family History  Problem Relation Age of Onset   Emphysema Father    Scoliosis Sister     Social History   Tobacco Use   Smoking status: Never   Smokeless tobacco: Never  Vaping Use   Vaping Use: Never used  Substance Use Topics   Alcohol use: Not Currently    Comment: occassionally   Drug use: No    Prior to Admission medications   Medication Sig Start Date End Date Taking? Authorizing Provider  cephALEXin (KEFLEX) 500 MG capsule Take 1 capsule (500 mg total) by mouth 4 (four) times daily. 05/19/21  Yes Lilyan Prete, MD  acetaminophen (TYLENOL) 325 MG tablet Take 650 mg by mouth every 6 (six) hours as needed.    [provider]  Ascorbic Acid (VITAMIN C) 1000 MG tablet Take 1,000 mg by mouth daily.    [provider]  aspirin 81 MG tablet Take 81 mg by mouth daily.     [provider]  Azelaic Acid 15 % gel Apply topically 2 (two) times daily. After skin is thoroughly washed and patted dry, gently but thoroughly massage a thin film of azelaic acid cream into the affected area twice daily, in the morning and evening.    [provider]  Cholecalciferol (VITAMIN D3 PO) Take 1,000 Units by mouth daily.     [provider]  Cyanocobalamin (VITAMIN B 12 PO) Take 1,000 mg by mouth.    [provider]  enalapril (VASOTEC) 20 MG tablet Take 20 mg by mouth daily.    [provider]  fluticasone (FLONASE) 50 MCG/ACT nasal spray Place into both nostrils daily.    [provider]  HYDROcodone-acetaminophen (NORCO) 10-325 MG tablet Take 1 tablet by mouth every 6 (six) hours as needed.    [provider]  magic mouthwash SOLN SWISH AND SWALLOW 30 MLS BY MOUTH THREE TIMES DAILY AS NEEDED 02/17/21   [provider]  meloxicam (MOBIC) 15 MG tablet Taking 1 every other day 10/01/18   [provider]  mirabegron ER (MYRBETRIQ) 50 MG TB24 tablet Take 50 mg by mouth daily.    [provider]  mometasone (ELOCON) 0.1 % cream Apply 1 application topically 2 (two) times daily.    [provider]  Multiple Vitamin (MULTIVITAMIN) capsule Take 1 capsule by mouth daily.    [provider]  Omega-3 Fatty Acids (FISH OIL PO) Take by mouth.    [provider]  omeprazole (PRILOSEC) 20 MG capsule Take 1 capsule (20 mg total) by mouth daily. 01/26/13   Palumbo, April, MD  polyethylene glycol (MIRALAX / GLYCOLAX) packet Take 17 g by mouth daily as needed.    [provider]  predniSONE (DELTASONE) 20 MG tablet Take by mouth. 02/17/21   [provider]  primidone (MYSOLINE) 250 MG tablet Take 1 tablet twice daily 02/28/21   Kathrynn Ducking, MD  primidone (MYSOLINE) 50 MG tablet Take 1 tablet (50 mg total) by mouth in the morning and at bedtime. 04/20/21   Marcial Pacas, MD  senna-docusate (SENOKOT-S) 8.6-50 MG tablet Take 2 tablets by mouth 2 (two) times daily.    [provider]  simvastatin (ZOCOR) 20 MG tablet Take 20 mg by mouth daily.    [provider]  tamsulosin (FLOMAX) 0.4 MG CAPS capsule Take 0.4 mg by mouth.    [provider]  triamcinolone (KENALOG) 0.1 % paste Apply to tongue/oral cavity ulcers twice daily for 21 days 02/08/21   [provider]    Allergies Patient has no known allergies.   REVIEW OF SYSTEMS  Negative except as noted here or in the History of Present Illness.   PHYSICAL EXAMINATION  Initial Vital Signs Blood pressure (!) 180/84, pulse 60, temperature 98.6 F (37 C), temperature source Oral, resp. rate 18, SpO2 95 %.  Examination General: Well-developed, well-nourished male in no acute distress; appearance consistent with age of record HENT: normocephalic; atraumatic Eyes: Normal appearance Neck: supple Heart: regular rate and rhythm Lungs: clear to auscultation bilaterally Abdomen: soft; nondistended; nontender; bowel sounds present Extremities: No deformity; full range of motion; +1 edema of lower legs; superficial amputation of tip of left great toe:    Neurologic: Awake, alert and oriented; motor function intact in all extremities and symmetric; no facial droop; hard of hearing Skin: Warm and dry Psychiatric: Normal mood and affect   RESULTS  Summary of this visit's results, reviewed and interpreted by myself:   EKG Interpretation  Date/Time:    Ventricular Rate:    PR Interval:    QRS Duration:   QT Interval:    QTC Calculation:   R Axis:     Text Interpretation:         Laboratory Studies: No results found for this or any previous visit (from the past 24 hour(s)). Imaging Studies: No results found.  ED COURSE and MDM  Nursing notes, initial and subsequent vitals signs, including pulse oximetry, reviewed and interpreted by myself.  Vitals:    05/19/21 2153  BP: (!) 180/84  Pulse: 60  Resp: 18  Temp: 98.6 F (37 C)  TempSrc: Oral  SpO2: 95%   Medications  Tdap (BOOSTRIX) injection 0.5 mL (has no administration in time range)  Thrombi-Pad 3"X3" pad 1 each (has no administration in time range)  cephALEXin (KEFLEX) capsule 500 mg (500 mg Oral Given 05/19/21 2337)   We will dressed the wound with thrombin pad and start him on Keflex for infection prophylaxis given his age.  We will also update his tetanus.   PROCEDURES  Procedures   ED DIAGNOSES     ICD-10-CM   1. Laceration of left  great toe without foreign body present or damage to nail, initial encounter  L89.211H          Shanon Rosser, MD 05/19/21 2340

## 2022-02-27 NOTE — Progress Notes (Signed)
Patient: Juan Combs Date of Birth: [birth year]  Reason for Visit: Follow up History from: Patient Primary Neurologist: Willis/Yan  ASSESSMENT AND PLAN 86 y.o. year old male   41.  Peripheral neuropathy 2.  Essential tremor -Doing overall well, he has no pain from his neuropathy -Eliminate the 50 mg BID tablet of primidone, continue 250 mg twice daily for tremor -Encouraged to continue exercise, strength training, be careful not to fall -Follow-up in 1 year or sooner if needed, he has labs done through PCP  HISTORY OF PRESENT ILLNESS: Today 02/28/22 [name] is here today for follow-up. His throat cancer is in remission. Still having trouble swallowing, often chokes. He takes gabapentin 300 mg TID, for back pain. Neuropathy is some better. [facility] has Physiological scientist. He rides stationary bike the rest of the days for about 25 minutes. Using a cane, no falls. Neuropathy isn't painful. Wears bilateral AFO. He drives, he has a motor home. Tremor is in hands, on primidone 300 mg BID, going to drop back to 250 mg BID, tired of taking the small 50 mg, also gait instability. His writing is impaired. He works part time, he does Glass blower/designer.   HISTORY 02/28/2021 Dr Jannifer Franklin: Mr. Bathe is an 86 year old right-handed white male with a history of an essential tremor and a relatively severe peripheral neuropathy associated with a gait disorder.  The patient uses bilateral AFO braces and uses a cane for ambulation, he has fallen on occasion.  He feels that the right leg is weaker than the left.  Unfortunately in the last several months he was diagnosed with throat cancer and has received radiation therapy but he now has a nonhealing ulcer at the base of the tongue on the left.  He has a lot of pain associated with this.  He has very little pain with his peripheral neuropathy.  He has cut back on the gabapentin taking 300 mg twice daily in part because he was having  difficulty swallowing the medication.  The patient had a fall within the last month while taking oxycodone, this resulted in increased gait instability, he has done better when switching back to the hydrocodone.  He may use a walker inside the house.  He returns for an evaluation.  He has lost weight because of the throat cancer.   REVIEW OF SYSTEMS: Out of a complete 14 system review of symptoms, the patient complains only of the following symptoms, and all other reviewed systems are negative.  See HPI  ALLERGIES: No Known Allergies  HOME MEDICATIONS: Outpatient Medications Prior to Visit  Medication Sig Dispense Refill   acetaminophen (TYLENOL) 325 MG tablet Take 650 mg by mouth every 6 (six) hours as needed.     ALPRAZolam (XANAX) 0.5 MG tablet Take 0.5 mg by mouth at bedtime as needed.     Ascorbic Acid (VITAMIN C) 1000 MG tablet Take 1,000 mg by mouth daily.     aspirin 81 MG tablet Take 81 mg by mouth daily.     Azelaic Acid 15 % gel Apply topically 2 (two) times daily. After skin is thoroughly washed and patted dry, gently but thoroughly massage a thin film of azelaic acid cream into the affected area twice daily, in the morning and evening.     cephALEXin (KEFLEX) 500 MG capsule Take 1 capsule (500 mg total) by mouth 4 (four) times daily. 20 capsule 0   Cholecalciferol (VITAMIN D3 PO) Take 1,000 Units  by mouth daily.      Cyanocobalamin (VITAMIN B 12 PO) Take 1,000 mg by mouth.     enalapril (VASOTEC) 20 MG tablet Take 20 mg by mouth daily.     fluticasone (FLONASE) 50 MCG/ACT nasal spray Place into both nostrils daily.     magic mouthwash SOLN SWISH AND SWALLOW 30 MLS BY MOUTH THREE TIMES DAILY AS NEEDED     meloxicam (MOBIC) 15 MG tablet Taking 1 every other day     mirabegron ER (MYRBETRIQ) 50 MG TB24 tablet Take 50 mg by mouth daily.     mometasone (ELOCON) 0.1 % cream Apply 1 application topically 2 (two) times daily.     omeprazole (PRILOSEC) 20 MG capsule Take 1 capsule (20  mg total) by mouth daily. 30 capsule 0   polyethylene glycol (MIRALAX / GLYCOLAX) packet Take 17 g by mouth daily as needed.     senna-docusate (SENOKOT-S) 8.6-50 MG tablet Take 2 tablets by mouth 2 (two) times daily.     simvastatin (ZOCOR) 20 MG tablet Take 20 mg by mouth daily.     tamsulosin (FLOMAX) 0.4 MG CAPS capsule Take 0.4 mg by mouth.     primidone (MYSOLINE) 250 MG tablet Take 1 tablet twice daily 180 tablet 3   primidone (MYSOLINE) 50 MG tablet Take 1 tablet (50 mg total) by mouth in the morning and at bedtime. 60 tablet 11   HYDROcodone-acetaminophen (NORCO) 10-325 MG tablet Take 1 tablet by mouth every 6 (six) hours as needed.     Multiple Vitamin (MULTIVITAMIN) capsule Take 1 capsule by mouth daily.     Omega-3 Fatty Acids (FISH OIL PO) Take by mouth.     predniSONE (DELTASONE) 20 MG tablet Take by mouth.     triamcinolone (KENALOG) 0.1 % paste Apply to tongue/oral cavity ulcers twice daily for 21 days     No facility-administered medications prior to visit.    PAST MEDICAL HISTORY: Past Medical History:  Diagnosis Date   Foot drop, bilateral 07/28/2014   Gait disorder 03/12/2014   Hereditary and idiopathic peripheral neuropathy 03/05/2014   Hyperlipidemia    Hypertension    Lumbar radiculopathy 03/05/2014   Neck pain    Peripheral neuropathy    bilateral feet   Prostate cancer ([place])    currently has seed implant   Prostate enlargement    Spinal stenosis    Tremor    Urinary retention     PAST SURGICAL HISTORY: Past Surgical History:  Procedure Laterality Date   CERVICAL SPINE SURGERY     KNEE ARTHROSCOPY     NASAL SINUS SURGERY     VASECTOMY     VASOTOMY      FAMILY HISTORY: Family History  Problem Relation Age of Onset   Emphysema Father    Scoliosis Sister     SOCIAL HISTORY: Social History   Socioeconomic History   Marital status: Married    Spouse name: Not on file   Number of children: Not on file   Years of education: Not on file    Highest education level: Not on file  Occupational History   Not on file  Tobacco Use   Smoking status: Never   Smokeless tobacco: Never  Vaping Use   Vaping Use: Never used  Substance and Sexual Activity   Alcohol use: Not Currently    Comment: occassionally   Drug use: No   Sexual activity: Not on file  Other Topics Concern   Not on file  Social History Narrative   Patient is right handed.   Patient drinks very little caffeine.   Social Determinants of Health   Financial Resource Strain: Not on file  Food Insecurity: Not on file  Transportation Needs: Not on file  Physical Activity: Not on file  Stress: Not on file  Social Connections: Not on file  Intimate Partner Violence: Not on file   PHYSICAL EXAM  Vitals:   02/28/22 0943  BP: 133/76  Pulse: (!) 57  Weight: 208 lb (94.3 kg)  Height: '5\' 9"'$  (1.753 m)   Body mass index is 30.72 kg/m.  Generalized: Well developed, in no acute distress  Neurological examination  Mentation: Alert oriented to time, place, history taking. Follows all commands speech and language fluent, voice is raspy Cranial nerve II-XII: Pupils were equal round reactive to light. Extraocular movements were full, visual field were full on confrontational test. Facial sensation and strength were normal. Head turning and shoulder shrug  were normal and symmetric. Motor: Good strength overall, 4/5 right hip flexion, bilateral foot drop Sensory: Sensory testing is intact to soft touch on all 4 extremities. No evidence of extinction is noted.  Coordination: Cerebellar testing reveals good finger-nose-finger and heel-to-shin bilaterally.  Mild tremor with finger-nose-finger.  Mild tremor with handwriting, spiral drawl. Gait and station: Gait is wide-based, cautious, he walks with a cane, wearing bilateral AFO braces Reflexes: Deep tendon reflexes are symmetric but decreased  DIAGNOSTIC DATA (LABS, IMAGING, TESTING) - I reviewed patient records, labs,  notes, testing and imaging myself where available.  Lab Results  Component Value Date   WBC 4.9 03/09/2018   HGB 11.5 (L) 03/09/2018   HCT 37.0 (L) 03/09/2018   MCV 96.6 03/09/2018   PLT 179 03/09/2018      Component Value Date/Time   NA 141 03/09/2018 2240   K 4.3 03/09/2018 2240   CL 107 03/09/2018 2240   CO2 26 03/09/2018 2240   GLUCOSE 97 03/09/2018 2240   BUN 23 03/09/2018 2240   CREATININE 0.88 03/09/2018 2240   CALCIUM 8.6 (L) 03/09/2018 2240   PROT 6.2 03/12/2014 0948   ALBUMIN 3.9 01/25/2013 2335   AST 20 01/25/2013 2335   ALT 18 01/25/2013 2335   ALKPHOS 44 01/25/2013 2335   BILITOT 0.5 01/25/2013 2335   GFRNONAA >60 03/09/2018 2240   GFRAA >60 03/09/2018 2240   No results found for: "CHOL", "HDL", "LDLCALC", "LDLDIRECT", "TRIG", "CHOLHDL" No results found for: "HGBA1C" No results found for: "VITAMINB12" No results found for: "TSH"  Butler Denmark, AGNP-C, DNP 02/28/2022, 10:21 AM [facility] [address] [place], [place] [address] [phone]

## 2022-02-28 ENCOUNTER — Encounter: Payer: Self-pay | Admitting: Neurology

## 2022-02-28 ENCOUNTER — Ambulatory Visit: Payer: Medicare Other | Admitting: Neurology

## 2022-02-28 VITALS — BP 133/76 | HR 57 | Ht 69.0 in | Wt 208.0 lb

## 2022-02-28 DIAGNOSIS — R251 Tremor, unspecified: Secondary | ICD-10-CM

## 2022-02-28 DIAGNOSIS — G629 Polyneuropathy, unspecified: Secondary | ICD-10-CM | POA: Diagnosis not present

## 2022-02-28 MED ORDER — PRIMIDONE 250 MG PO TABS: ORAL_TABLET | ORAL | 3 refills | Status: DC

## 2022-02-28 NOTE — Patient Instructions (Signed)
Great to see you today Eliminate the 50 mg primidone tablet, we will continue 250 mg twice daily See you back in 1 year

## 2022-07-19 ENCOUNTER — Ambulatory Visit: Payer: Medicare Other | Admitting: Neurology

## 2022-07-19 ENCOUNTER — Encounter: Payer: Self-pay | Admitting: Neurology

## 2022-07-19 VITALS — BP 120/80 | HR 59 | Ht 69.0 in | Wt 208.0 lb

## 2022-07-19 DIAGNOSIS — R251 Tremor, unspecified: Secondary | ICD-10-CM

## 2022-07-19 DIAGNOSIS — M21372 Foot drop, left foot: Secondary | ICD-10-CM | POA: Diagnosis not present

## 2022-07-19 DIAGNOSIS — M21371 Foot drop, right foot: Secondary | ICD-10-CM | POA: Diagnosis not present

## 2022-07-19 DIAGNOSIS — G629 Polyneuropathy, unspecified: Secondary | ICD-10-CM

## 2022-07-19 NOTE — Patient Instructions (Signed)
Continue your exercises, work on your balance, be careful not to fall. Try to cut back the Primidone taking 1/2 tablet in the AM, keep the 1 tablet at night, you may still cut it back to 1 tablet at bedtime if you tolerate. See if your balance improves. Keep appointment in October.

## 2022-07-19 NOTE — Progress Notes (Signed)
Patient: Juan Combs Date of Birth: [birth year]  Reason for Visit: Follow up History from: Patient Primary Neurologist: Juan Combs  ASSESSMENT AND PLAN 87 y.o. year old male   25.  Peripheral neuropathy with bilateral foot drop  2.  Essential tremor 3.  Gait disorder   -Seems like his symptoms are gradually progressing overtime, but he remains active and committed to his exercise program and functional in life. We discussed repeating EMG, in 2019 showed severe peripheral neuropathy, not sure would change clinical plan. Will hold off for now. We will try to reduce the primidone from 250 mg BID to 125/250 to see if any improvement in balance? Keep appointment with me in October.  July 2019 Dr. Jannifer Combs: Nerve conduction studies done on both upper extremities and on the right lower extremity shows evidence of a severe, end-stage primarily axonal peripheral neuropathy. There appears to be diffuse neuropathic changes in both hands, the subjective numbness reported by the patient may likely be related to the neuropathy, not associated with an isolated carpal tunnel syndrome. EMG evaluation of the right lower extremity shows distal chronic and acute denervation consistent with a peripheral neuropathy, there is no clear evidence of an overlying lumbosacral radiculopathy.   HISTORY OF PRESENT ILLNESS: Today 07/19/22 Here today to discuss some concerns: more right foot drop. Still works out 3 days a week with a Clinical research associate, he can do nearly all the exercises for leg strength. He has poor balance. He has more numbness to his legs. Had 1 fall, was random event. More swelling to right leg at the end of the days, compression stockings were recommended. Is not diabetic. Has chronic numbness to hands. Sound as tho symptoms are gradually progressing. His tremor was not apparent today. Remains on primidone 250 mg BID. Still has tremor with eating is bothersome. His wife has cancer, has a good prognosis. He is active,  makes strong effort to do his exercises, stretching, squats.   EMG with Dr. Jannifer Combs July 2019. Nerve conduction studies done on both upper extremities and on the right lower extremity shows evidence of a severe, end-stage primarily axonal peripheral neuropathy. There appears to be diffuse neuropathic changes in both hands, the subjective numbness reported by the patient may likely be related to the neuropathy, not associated with an isolated carpal tunnel syndrome. EMG evaluation of the right lower extremity shows distal chronic and acute denervation consistent with a peripheral neuropathy, there is no clear evidence of an overlying lumbosacral radiculopathy.   02/28/22 SS: Juan Combs is here today for follow-up. His throat cancer is in remission. Still having trouble swallowing, often chokes. He takes gabapentin 300 mg TID, for back pain. Neuropathy is some better. [facility], has Physiological scientist. He rides stationary bike the rest of the days for about 25 minutes. Using a cane, no falls. Neuropathy isn't painful. Wears bilateral AFO. He drives, he has a motor home. Tremor is in hands, on primidone 300 mg BID, going to drop back to 250 mg BID, tired of taking the small 50 mg, also gait instability. His writing is impaired. He works part time, he does Glass blower/designer.   HISTORY 02/28/2021 Dr Juan Combs: Juan Combs is an 87 year old right-handed white male with a history of an essential tremor and a relatively severe peripheral neuropathy associated with a gait disorder.  The patient uses bilateral AFO braces and uses a cane for ambulation, he has fallen on occasion.  He feels that the right leg  is weaker than the left.  Unfortunately in the last several months he was diagnosed with throat cancer and has received radiation therapy but he now has a nonhealing ulcer at the base of the tongue on the left.  He has a lot of pain associated with this.  He has very little pain with his  peripheral neuropathy.  He has cut back on the gabapentin taking 300 mg twice daily in part because he was having difficulty swallowing the medication.  The patient had a fall within the last month while taking oxycodone, this resulted in increased gait instability, he has done better when switching back to the hydrocodone.  He may use a walker inside the house.  He returns for an evaluation.  He has lost weight because of the throat cancer.   REVIEW OF SYSTEMS: Out of a complete 14 system review of symptoms, the patient complains only of the following symptoms, and all other reviewed systems are negative.  See HPI  ALLERGIES: No Known Allergies  HOME MEDICATIONS: Outpatient Medications Prior to Visit  Medication Sig Dispense Refill   acetaminophen (TYLENOL) 325 MG tablet Take 650 mg by mouth every 6 (six) hours as needed.     ALPRAZolam (XANAX) 0.5 MG tablet Take 0.5 mg by mouth at bedtime as needed.     Ascorbic Acid (VITAMIN C) 1000 MG tablet Take 1,000 mg by mouth daily.     aspirin 81 MG tablet Take 81 mg by mouth daily.     Azelaic Acid 15 % gel Apply topically 2 (two) times daily. After skin is thoroughly washed and patted dry, gently but thoroughly massage a thin film of azelaic acid cream into the affected area twice daily, in the morning and evening.     Cholecalciferol (VITAMIN D3 PO) Take 1,000 Units by mouth daily.      Cyanocobalamin (VITAMIN B 12 PO) Take 1,000 mg by mouth.     enalapril (VASOTEC) 20 MG tablet Take 20 mg by mouth daily.     fluticasone (FLONASE) 50 MCG/ACT nasal spray Place into both nostrils daily.     gabapentin (NEURONTIN) 400 MG capsule Take by mouth.     magic mouthwash SOLN SWISH AND SWALLOW 30 MLS BY MOUTH THREE TIMES DAILY AS NEEDED     meloxicam (MOBIC) 15 MG tablet Taking 1 every other day     mirabegron ER (MYRBETRIQ) 50 MG TB24 tablet Take 50 mg by mouth daily.     mometasone (ELOCON) 0.1 % cream Apply 1 application topically 2 (two) times daily.      omeprazole (PRILOSEC) 20 MG capsule Take 1 capsule (20 mg total) by mouth daily. 30 capsule 0   polyethylene glycol (MIRALAX / GLYCOLAX) packet Take 17 g by mouth daily as needed.     primidone (MYSOLINE) 250 MG tablet Take 1 tablet twice daily 180 tablet 3   senna-docusate (SENOKOT-S) 8.6-50 MG tablet Take 2 tablets by mouth 2 (two) times daily.     simvastatin (ZOCOR) 20 MG tablet Take 20 mg by mouth daily.     tamsulosin (FLOMAX) 0.4 MG CAPS capsule Take 0.4 mg by mouth.     traMADol (ULTRAM) 50 MG tablet      cephALEXin (KEFLEX) 500 MG capsule Take 1 capsule (500 mg total) by mouth 4 (four) times daily. 20 capsule 0   No facility-administered medications prior to visit.    PAST MEDICAL HISTORY: Past Medical History:  Diagnosis Date   Foot drop, bilateral 07/28/2014  Gait disorder 03/12/2014   Hereditary and idiopathic peripheral neuropathy 03/05/2014   Hyperlipidemia    Hypertension    Lumbar radiculopathy 03/05/2014   Neck pain    Peripheral neuropathy    bilateral feet   Prostate cancer ([place])    currently has seed implant   Prostate enlargement    Spinal stenosis    Tremor    Urinary retention     PAST SURGICAL HISTORY: Past Surgical History:  Procedure Laterality Date   CERVICAL SPINE SURGERY     KNEE ARTHROSCOPY     NASAL SINUS SURGERY     VASECTOMY     VASOTOMY      FAMILY HISTORY: Family History  Problem Relation Age of Onset   Emphysema Father    Scoliosis Sister     SOCIAL HISTORY: Social History   Socioeconomic History   Marital status: Married    Spouse name: Not on file   Number of children: Not on file   Years of education: Not on file   Highest education level: Not on file  Occupational History   Not on file  Tobacco Use   Smoking status: Never   Smokeless tobacco: Never  Vaping Use   Vaping Use: Never used  Substance and Sexual Activity   Alcohol use: Not Currently    Comment: occassionally   Drug use: No   Sexual activity: Not  on file  Other Topics Concern   Not on file  Social History Narrative   Patient is right handed.   Patient drinks very little caffeine.   Social Determinants of Health   Financial Resource Strain: Not on file  Food Insecurity: Not on file  Transportation Needs: Not on file  Physical Activity: Not on file  Stress: Not on file  Social Connections: Not on file  Intimate Partner Violence: Not on file   PHYSICAL EXAM  Vitals:   07/19/22 1013 07/19/22 1034  BP: (!) 104/56 120/80  Pulse: (!) 59   Weight: 208 lb (94.3 kg)   Height: '5\' 9"'$  (1.753 m)     Body mass index is 30.72 kg/m.  Generalized: Well developed, in no acute distress  Neurological examination  Mentation: Alert oriented to time, place, history taking. Follows all commands speech and language fluent, voice is raspy Cranial nerve II-XII: Pupils were equal round reactive to light. Extraocular movements were full, visual field were full on confrontational test. Facial sensation and strength were normal. Head turning and shoulder shrug  were normal and symmetric. Motor: Good strength overall, 4/5 right hip flexion, bilateral foot drop Sensory: Length dependent sensory deficit soft touch  Coordination: Cerebellar testing reveals good finger-nose-finger and heel-to-shin bilaterally.  I didn't see any tremor today Gait and station: Gait is wide-based, cautious, he walks with a cane, wearing bilateral AFO braces Reflexes: Deep tendon reflexes are symmetric but decreased  DIAGNOSTIC DATA (LABS, IMAGING, TESTING) - I reviewed patient records, labs, notes, testing and imaging myself where available.  Lab Results  Component Value Date   WBC 4.9 03/09/2018   HGB 11.5 (L) 03/09/2018   HCT 37.0 (L) 03/09/2018   MCV 96.6 03/09/2018   PLT 179 03/09/2018      Component Value Date/Time   NA 141 03/09/2018 2240   K 4.3 03/09/2018 2240   CL 107 03/09/2018 2240   CO2 26 03/09/2018 2240   GLUCOSE 97 03/09/2018 2240   BUN 23  03/09/2018 2240   CREATININE 0.88 03/09/2018 2240   CALCIUM 8.6 (L) 03/09/2018 2240  PROT 6.2 03/12/2014 0948   ALBUMIN 3.9 01/25/2013 2335   AST 20 01/25/2013 2335   ALT 18 01/25/2013 2335   ALKPHOS 44 01/25/2013 2335   BILITOT 0.5 01/25/2013 2335   GFRNONAA >60 03/09/2018 2240   GFRAA >60 03/09/2018 2240   No results found for: "CHOL", "HDL", "LDLCALC", "LDLDIRECT", "TRIG", "CHOLHDL" No results found for: "HGBA1C" No results found for: "VITAMINB12" No results found for: "TSH"  Butler Denmark, AGNP-C, DNP 07/19/2022, 10:35 AM [facility] [address] [place], [place] [address] [phone]

## 2022-08-16 NOTE — Progress Notes (Signed)
Chart reviewed, agree above plan ?

## 2022-08-22 ENCOUNTER — Other Ambulatory Visit: Payer: Self-pay | Admitting: Rehabilitation

## 2022-08-22 DIAGNOSIS — M5416 Radiculopathy, lumbar region: Secondary | ICD-10-CM

## 2022-08-22 DIAGNOSIS — S335XXA Sprain of ligaments of lumbar spine, initial encounter: Secondary | ICD-10-CM

## 2022-09-02 ENCOUNTER — Ambulatory Visit
Admission: RE | Admit: 2022-09-02 | Discharge: 2022-09-02 | Disposition: A | Discharge status: Home / Self Care | Payer: Medicare Other | Source: Ambulatory Visit | Attending: Rehabilitation | Admitting: Rehabilitation

## 2022-09-02 DIAGNOSIS — M5416 Radiculopathy, lumbar region: Secondary | ICD-10-CM

## 2022-09-02 DIAGNOSIS — S335XXA Sprain of ligaments of lumbar spine, initial encounter: Secondary | ICD-10-CM

## 2022-09-06 ENCOUNTER — Other Ambulatory Visit: Payer: Self-pay | Admitting: Orthopaedic Surgery

## 2022-09-06 DIAGNOSIS — S14129D Central cord syndrome at unspecified level of cervical spinal cord, subsequent encounter: Secondary | ICD-10-CM

## 2022-09-06 DIAGNOSIS — M4712 Other spondylosis with myelopathy, cervical region: Secondary | ICD-10-CM

## 2022-09-06 DIAGNOSIS — M4802 Spinal stenosis, cervical region: Secondary | ICD-10-CM

## 2022-09-24 ENCOUNTER — Ambulatory Visit
Admission: RE | Admit: 2022-09-24 | Discharge: 2022-09-24 | Disposition: A | Discharge status: Home / Self Care | Payer: Medicare Other | Source: Ambulatory Visit | Attending: Orthopaedic Surgery | Admitting: Orthopaedic Surgery

## 2022-09-24 DIAGNOSIS — M4712 Other spondylosis with myelopathy, cervical region: Secondary | ICD-10-CM

## 2022-09-24 DIAGNOSIS — M4802 Spinal stenosis, cervical region: Secondary | ICD-10-CM

## 2022-09-24 DIAGNOSIS — S14129D Central cord syndrome at unspecified level of cervical spinal cord, subsequent encounter: Secondary | ICD-10-CM

## 2023-02-22 ENCOUNTER — Other Ambulatory Visit: Payer: Self-pay

## 2023-02-22 MED ORDER — PRIMIDONE 250 MG PO TABS: ORAL_TABLET | ORAL | 3 refills | Status: AC

## 2023-03-01 ENCOUNTER — Ambulatory Visit: Payer: Medicare Other | Admitting: Neurology

## 2023-03-15 ENCOUNTER — Telehealth: Payer: Self-pay | Admitting: Neurology

## 2023-03-15 ENCOUNTER — Encounter: Payer: Self-pay | Admitting: Neurology

## 2023-03-15 ENCOUNTER — Ambulatory Visit: Payer: Medicare Other | Admitting: Neurology

## 2023-03-15 VITALS — BP 136/78 | Ht 68.0 in | Wt 205.0 lb

## 2023-03-15 DIAGNOSIS — G629 Polyneuropathy, unspecified: Secondary | ICD-10-CM

## 2023-03-15 DIAGNOSIS — M21372 Foot drop, left foot: Secondary | ICD-10-CM

## 2023-03-15 DIAGNOSIS — M21371 Foot drop, right foot: Secondary | ICD-10-CM

## 2023-03-15 DIAGNOSIS — R251 Tremor, unspecified: Secondary | ICD-10-CM

## 2023-03-15 NOTE — Telephone Encounter (Signed)
Pt rescheduled follow up appointment

## 2023-03-15 NOTE — Progress Notes (Signed)
Patient: Juan Combs Date of Birth: 04/18/1936  Reason for Visit: Follow up History from: Patient Primary Neurologist: Willis/Yan  ASSESSMENT AND PLAN 87 y.o. year old male   1.  Peripheral neuropathy with bilateral foot drop  2.  Essential tremor 3.  Gait disorder   -Again we will try to reduce primidone from 250 mg twice daily down to 125 mg AM/250 mg PM to see if walking any better, steadier -Check primidone level today for baseline -Continue to be careful not to fall -Follow-up with me in 1 year or sooner if needed  HISTORY OF PRESENT ILLNESS: Today 03/15/23 Has been told his throat cancer is back, has lung cancer, is going to meet with thoracic surgeon coming up. Plan to try and control. Going to do immunotherapy. Neuropathy is some worse, feels weaker, without braces feels unsteady, right foot drops more, using quad cane or rollator. Few falls, no injury other than ego. We reduced primidone, but he kept taking 250 mg BID for essential tremor. Can sometimes be hard to swallow tablets. Has noticed increase in his tremor. Still doing some Engineer, technical sales at [employer]. He drives. Going to [place] for 3 months  07/19/22 SS: Here today to discuss some concerns: more right foot drop. Still works out 3 days a week with a Psychologist, educational, he can do nearly all the exercises for leg strength. He has poor balance. He has more numbness to his legs. Had 1 fall, was random event. More swelling to right leg at the end of the days, compression stockings were recommended. Is not diabetic. Has chronic numbness to hands. Sound as tho symptoms are gradually progressing. His tremor was not apparent today. Remains on primidone 250 mg BID. Still has tremor with eating is bothersome. His wife has cancer, has a good prognosis. He is active, makes strong effort to do his exercises, stretching, squats.   EMG with Dr. Anne Hahn July 2019. Nerve conduction studies done on both upper extremities and on  the right lower extremity shows evidence of a severe, end-stage primarily axonal peripheral neuropathy. There appears to be diffuse neuropathic changes in both hands, the subjective numbness reported by the patient may likely be related to the neuropathy, not associated with an isolated carpal tunnel syndrome. EMG evaluation of the right lower extremity shows distal chronic and acute denervation consistent with a peripheral neuropathy, there is no clear evidence of an overlying lumbosacral radiculopathy.   02/28/22 SS: [name] is here today for follow-up. His throat cancer is in remission. Still having trouble swallowing, often chokes. He takes gabapentin 300 mg TID, for back pain. Neuropathy is some better. [facility], has Systems analyst. He rides stationary bike the rest of the days for about 25 minutes. Using a cane, no falls. Neuropathy isn't painful. Wears bilateral AFO. He drives, he has a motor home. Tremor is in hands, on primidone 300 mg BID, going to drop back to 250 mg BID, tired of taking the small 50 mg, also gait instability. His writing is impaired. He works part time, he does Clinical biochemist.   HISTORY 02/28/2021 Dr Anne Hahn: [name] is an 87 year old right-handed white male with a history of an essential tremor and a relatively severe peripheral neuropathy associated with a gait disorder.  The patient uses bilateral AFO braces and uses a cane for ambulation, he has fallen on occasion.  He feels that the right leg is weaker than the left.  Unfortunately in the  last several months he was diagnosed with throat cancer and has received radiation therapy but he now has a nonhealing ulcer at the base of the tongue on the left.  He has a lot of pain associated with this.  He has very little pain with his peripheral neuropathy.  He has cut back on the gabapentin taking 300 mg twice daily in part because he was having difficulty swallowing the medication.  The patient had  a fall within the last month while taking oxycodone, this resulted in increased gait instability, he has done better when switching back to the hydrocodone.  He may use a walker inside the house.  He returns for an evaluation.  He has lost weight because of the throat cancer.   REVIEW OF SYSTEMS: Out of a complete 14 system review of symptoms, the patient complains only of the following symptoms, and all other reviewed systems are negative.  See HPI  ALLERGIES: No Known Allergies  HOME MEDICATIONS: Outpatient Medications Prior to Visit  Medication Sig Dispense Refill   ALPRAZolam (XANAX) 0.5 MG tablet Take 0.5 mg by mouth at bedtime as needed.     aspirin 81 MG tablet Take 81 mg by mouth daily.     Azelaic Acid 15 % gel Apply topically 2 (two) times daily. After skin is thoroughly washed and patted dry, gently but thoroughly massage a thin film of azelaic acid cream into the affected area twice daily, in the morning and evening.     Cholecalciferol (VITAMIN D3 PO) Take 1,000 Units by mouth daily.      Cyanocobalamin (VITAMIN B 12 PO) Take 1,000 mg by mouth.     enalapril (VASOTEC) 20 MG tablet Take 20 mg by mouth daily.     fluticasone (FLONASE) 50 MCG/ACT nasal spray Place into both nostrils daily.     gabapentin (NEURONTIN) 250 MG/5ML solution Take 400 mg by mouth 3 (three) times daily.     meloxicam (MOBIC) 15 MG tablet Taking 1 every other day     mirabegron ER (MYRBETRIQ) 50 MG TB24 tablet Take 50 mg by mouth daily.     mometasone (ELOCON) 0.1 % cream Apply 1 application topically 2 (two) times daily.     omeprazole (PRILOSEC) 20 MG capsule Take 1 capsule (20 mg total) by mouth daily. 30 capsule 0   polyethylene glycol (MIRALAX / GLYCOLAX) packet Take 17 g by mouth daily as needed.     primidone (MYSOLINE) 250 MG tablet Take 1 tablet twice daily 180 tablet 3   senna-docusate (SENOKOT-S) 8.6-50 MG tablet Take 2 tablets by mouth 2 (two) times daily.     simvastatin (ZOCOR) 20 MG tablet  Take 20 mg by mouth daily.     tamsulosin (FLOMAX) 0.4 MG CAPS capsule Take 0.4 mg by mouth.     traMADol (ULTRAM) 50 MG tablet      traMADol (ULTRAM-ER) 200 MG 24 hr tablet Take 200 mg by mouth daily.     acetaminophen (TYLENOL) 325 MG tablet Take 650 mg by mouth every 6 (six) hours as needed. (Patient not taking: Reported on 03/15/2023)     Ascorbic Acid (VITAMIN C) 1000 MG tablet Take 1,000 mg by mouth daily.     gabapentin (NEURONTIN) 400 MG capsule Take by mouth. (Patient not taking: Reported on 03/15/2023)     magic mouthwash SOLN SWISH AND SWALLOW 30 MLS BY MOUTH THREE TIMES DAILY AS NEEDED (Patient not taking: Reported on 03/15/2023)     No facility-administered medications prior  to visit.    PAST MEDICAL HISTORY: Past Medical History:  Diagnosis Date   Foot drop, bilateral 07/28/2014   Gait disorder 03/12/2014   Hereditary and idiopathic peripheral neuropathy 03/05/2014   Hyperlipidemia    Hypertension    Lumbar radiculopathy 03/05/2014   Neck pain    Peripheral neuropathy    bilateral feet   Prostate cancer ([facility])    currently has seed implant   Prostate enlargement    Spinal stenosis    Tremor    Urinary retention     PAST SURGICAL HISTORY: Past Surgical History:  Procedure Laterality Date   CERVICAL SPINE SURGERY     KNEE ARTHROSCOPY     NASAL SINUS SURGERY     VASECTOMY     VASOTOMY      FAMILY HISTORY: Family History  Problem Relation Age of Onset   Emphysema Father    Scoliosis Sister     SOCIAL HISTORY: Social History   Socioeconomic History   Marital status: Married    Spouse name: Not on file   Number of children: Not on file   Years of education: Not on file   Highest education level: Not on file  Occupational History   Not on file  Tobacco Use   Smoking status: Never   Smokeless tobacco: Never  Vaping Use   Vaping status: Never Used  Substance and Sexual Activity   Alcohol use: Not Currently    Comment: occassionally   Drug use: No    Sexual activity: Not on file  Other Topics Concern   Not on file  Social History Narrative   Patient is right handed.   Patient drinks very little caffeine.   Social Determinants of Health   Financial Resource Strain: Not on file  Food Insecurity: Low Risk  (11/13/2022)   Received from [facility]   Hunger Vital Sign    Worried About Running Out of Food in the Last Year: Never true    Ran Out of Food in the Last Year: Never true  Transportation Needs: Not on file (11/13/2022)  Physical Activity: Not on file  Stress: Not on file  Social Connections: Not on file  Intimate Partner Violence: Not on file   PHYSICAL EXAM  Vitals:   03/15/23 1527  BP: 136/78  Weight: 205 lb (93 kg)  Height: 5\' 8"  (1.727 m)    Body mass index is 31.17 kg/m.  Generalized: Well developed, in no acute distress  Neurological examination  Mentation: Alert oriented to time, place, history taking. Follows all commands speech and language fluent, voice is raspy Cranial nerve II-XII: Pupils were equal round reactive to light. Extraocular movements were full, visual field were full on confrontational test. Facial sensation and strength were normal. Head turning and shoulder shrug  were normal and symmetric. Motor: Good strength overall, 4/5 right hip flexion, bilateral foot drop Sensory: Length dependent sensory deficit soft touch  Coordination: Cerebellar testing reveals good finger-nose-finger and heel-to-shin bilaterally.  Mild tremor FTN more on left.  With handwriting sample tremor is mild to moderately transmitted Gait and station: Gait is wide-based, cautious, he walks with a cane, wearing bilateral AFO braces Reflexes: Deep tendon reflexes are symmetric but decreased  DIAGNOSTIC DATA (LABS, IMAGING, TESTING) - I reviewed patient records, labs, notes, testing and imaging myself where available.  Lab Results  Component Value Date   WBC 4.9 03/09/2018   HGB 11.5 (L) 03/09/2018   HCT 37.0 (L)  03/09/2018   MCV 96.6 03/09/2018  PLT 179 03/09/2018      Component Value Date/Time   NA 141 03/09/2018 2240   K 4.3 03/09/2018 2240   CL 107 03/09/2018 2240   CO2 26 03/09/2018 2240   GLUCOSE 97 03/09/2018 2240   BUN 23 03/09/2018 2240   CREATININE 0.88 03/09/2018 2240   CALCIUM 8.6 (L) 03/09/2018 2240   PROT 6.2 03/12/2014 0948   ALBUMIN 3.9 01/25/2013 2335   AST 20 01/25/2013 2335   ALT 18 01/25/2013 2335   ALKPHOS 44 01/25/2013 2335   BILITOT 0.5 01/25/2013 2335   GFRNONAA >60 03/09/2018 2240   GFRAA >60 03/09/2018 2240   No results found for: "CHOL", "HDL", "LDLCALC", "LDLDIRECT", "TRIG", "CHOLHDL" No results found for: "HGBA1C" No results found for: "VITAMINB12" No results found for: "TSH"  Margie Ege, AGNP-C, DNP 03/15/2023, 3:46 PM [facility] [address], [address] [place], [place] [address] [phone]

## 2023-03-15 NOTE — Patient Instructions (Addendum)
Reduce primidone down to 250 mg tablet, 1/2 tablet in AM, 1 tablet PM to see if any improvement with walking. Check Primidone level today

## 2023-03-16 LAB — PRIMIDONE, SERUM
Phenobarbital, Serum: 10 ug/mL — ABNORMAL LOW (ref 15–40)
Primidone Lvl: 10.6 ug/mL (ref 5.0–12.0)

## 2024-03-04 NOTE — H&P (Signed)
 ------------------------------------------------------------------------------- Attestation signed by Martine Lenell Andreas, MD at 03/04/2024  9:19 PM Juan Combs is a 88 y.o. 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 Selinda Albino, history of lumbar radiculopathy, chronic dysphagia postradiation therapy, hypertension hyperlipidemia, peripheral neuropathy history of hyponatremia who was admitted secondary to episodes of multiple falls.  Per patient's son patient has had 3 falls in past 48 hours, patient gave history of his leg giving away when he tried to ambulate with decreased sensation in bilateral lower extremity.  Patient noted to be hyponatremic with sodium of 125 during his initial admission  GEN: Pleasant 88 year old NAD, lying in bed, hard of hearing EYES: EOMI ENT: MMM CV: RRR, no murmurs appreciated PULM: Bilateral conducted breath sounds present upper airway, mild diffuse wheezing bilateral lower lung fields ABD: soft, distended nontender, +BS EXT: 2+ pitting edema up to bilateral shin area, NEURO: No focal deficit except patient with decreased power right leg 4 out of 5, fine touch sensations intact. PSYCH: A+Ox3, appropriate GU: No CVA tenderness MSK: No spinal tenderness  Assessment and Plan: Patient with history of lumbar radiculopathy, presenting with worsening weakness and falls.  Incidental finding of hyponatremia with clinical finding of volume overload.  Plans to obtain MRI of lumbar spine to rule out any spinal pathology, currently patient with no upper extremity weakness.  Obtain B12 level.  Workup for hyponatremia including SIADH although suspicion low given leg edema, follow urine lites urine osmolality and serum osmolality.   BNP essentially within normal, renal functions within normal.  Will give 1 dose of IV 20 mg Lasix, follow-up BMP in a.m.  Patient has been on liquid diet, will start patient on fluid restriction.   I have interviewed and examined the patient.  I have reviewed the chart including labs and imaging  and have supervised and helped formulate the plan of management with Arthea Caper, MD (Resident). I agree with the findings and plan documented  Martine Andreas  -------------------------------------------------------------------------------  [facility] [place] [facility] History and Physical  Subjective: Chief Complaint: History obtained from: History of Present Illness: Juan Combs is a 88 y.o. 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  Patient endorses lack of sensation in both legs for the past 2 days and progressive pain in the left leg. He can usually walk around using a walker. Within the past 24 hours, family was putting bandages on the buttocks which is when his legs also gave out, causing him to go to the ground. No loss of consciousness. Did not hit his head. He endorses lower back pain that has been there for years although it has been getting worse. He has had falls previously but those had been purely mechanical, nothing acute like current presentation.   He has been eating and drinking appropriately but can only eat soft foods such as muffins. Recently able to tolerate eggs. Has not had a BM for the past 7 days despite being on daily dulcolax and suppository. He does have a chronic issue with constipation. He has a constant productive cough. Denies fever or chills. He is having a difficult time taking medicines and keeping them  down. He is able to urinate normally  if he sits down.    He is in discussion with radiation oncology for palliative RT. He has sacral pain, therefore, had planned to undergo CT simulation first to plan the process of RT and was given prescriptions for oxycodone.          ED Course (reviewed and summarized):  EKG WNL, chest x-ray was unremarkable.  No leukocytosis, hemoglobin 10.9 with elevated MCV, platelet count WNL, CMP notable for hyponatremia of 125 and CL of 90, potassium of 4.9.  Normal renal and liver function.  Lactic acid was normal with no indication for sepsis.  Initial troponin of 30 7 repeat 39 with no chest pain.  BNP WNL  Medical History[1] Social History   Socioeconomic History  . Marital status: Married    Spouse name: Not on file  . Number of children: Not on file  . Years of education: Not on file  . Highest education level: Not on file  Occupational History  . Not on file  Tobacco Use  . Smoking status: Never  . Smokeless tobacco: Never  Vaping Use  . Vaping status: Never Used  Substance and Sexual Activity  . Alcohol use: Not Currently  . Drug use: No  . Sexual activity: Not on file  Other Topics Concern  . 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 Within the past 12 months, you worried that your food would run out before you got [place] to buy more: Never true   . Within the past 12 months, the food you bought just didn't last and you didn't have [place] to get more: Never true  Transportation Needs: No Transportation Needs (12/10/2023)   Transportation   . In the past 12 months, has lack of reliable transportation kept you from medical appointments, meetings, work or from getting things needed for daily living? : No  Safety: Low Risk  (12/10/2023)   Safety   . How often does anyone, including family and friends, physically hurt you?: Never   . How  often does anyone, including family and friends, insult or talk down to you?: Never   . How often does anyone, including family and friends, threaten you with harm?: Never   . How often does anyone, including family and friends, scream or curse at you?: Never  Living Situation: Low Risk  (12/10/2023)   Living Situation   . What is your living situation today?: I have a steady place to live   . Think about the place you live. Do you have problems with any of the following? Choose all that apply:: None/None on this list   Family History[2]  Pertinent items are noted in HPI.  Does the patient have functional status limitations?: Yes:    OBJECTIVE: Physical Exam: Vital Signs: Vitals:   03/04/24 1500  BP: 135/83  Pulse: 78  Resp: 15  Temp:   SpO2: 96%   CT Head WO Contrast Result Date: 03/04/2024 No acute intracranial abnormality. However, if there is clinical concern for acute ischemia, MRI could further evaluate as CT is relatively insensitive for the detection of an acute infarct in the first 24 to 48 hours.  XR Tibia Fibula 2 Views Left Result Date: 03/04/2024 1.  No acute fracture or malalignment. 2.  Soft tissue swelling about  the ankle. 3.  Mild degenerative changes of the tibiotalar joint and midfoot. 4.  Vascular calcifications.  XR Hip Uni W Or W/O Pelvis 2-3 Vw Left Result Date: 03/04/2024 1.  No acute fracture. 2.  No malalignment. 3.  Moderate degenerative changes of the bilateral hips, sacroiliac joints, and pubic symphysis. 4.  Prostate brachytherapy seeds.   XR Knee 1-2 Views Left Result Date: 03/04/2024 1.  No acute fracture or malalignment. 2.  No joint effusion. 3.  Mild tricompartment degenerative changes. 4.  Vascular calcifications.  XR Chest 1 View Result Date: 03/04/2024 1.  Diffuse basilar streaky opacifications, likely subsegmental atelectasis and vascular crowding in setting of low lung volumes. 2.  No focal consolidation. No discernible pneumothorax.    Constitutional: Moderate distress secondary to chronic cough, resting in bed, with difficult hearing HEENT: EOMI, PERRL, MMM Neck: Supple and full range of motion Cardiovascular: regular rhythm, normal S1 and S2, no m/r/g, peripheral pulses palpable Respiratory: Lungs clear to auscultation bilaterally. Normal work of breathing on room air  Abdominal: Soft, nondistended, nontender to palpation  Skin: No obvious rashes or lesions. Warm and dry.  MSK: Normal strength and range of motion in all extremities.  No peripheral edema.  Neuro: Alert and oriented x3. Following commands. No focal deficits.  Psych: Appropriate mood and affect  CBC:  Results from last 7 days  Lab Units 03/04/24 1121  WHITE BLOOD CELL COUNT 10*3/uL 6.20  HEMOGLOBIN g/dL 89.0*  HEMATOCRIT % 68.4*  PLATELET COUNT 10*3/uL 177   CBD:  Results from last 7 days  Lab Units 03/04/24 1121  WHITE BLOOD CELL COUNT 10*3/uL 6.20  RED BLOOD CELL COUNT 10*6/uL 3.23*  HEMOGLOBIN g/dL 89.0*  HEMATOCRIT % 68.4*  MEAN CORPUSCULAR VOLUME fL 97.5*  MEAN CORPUSCULAR HEMOGLOBIN pg 33.9*  MEAN CORPUSCULAR HEMOGLOBIN CONC g/dL 65.2  RED CELL DISTRIBUTION WIDTH % 15.9  MEAN PLATELET VOLUME fL 8.0  PLATELET COUNT 10*3/uL 177  LYMPHOCYTES RELATIVE PERCENT % 1  NEUTROPHILS RELATIVE PERCENT % 80  MONOCYTES RELATIVE PERCENT % 15  EOSINOPHILS RELATIVE PERCENT % 3  BASOPHILS RELATIVE PERCENT % 0  NEUTROPHILS ABSOLUTE COUNT 10*3/uL 5.00  LYMPHOCYTES ABSOLUTE COUNT 10*3/uL 0.10*  MONOCYTES ABSOLUTE COUNT 10*3/uL 0.90*  EOSINOPHILS ABSOLUTE COUNT 10*3/uL 0.20  BASOPHILS ABSOLUTE COUNT 10*3/uL 0.00   CMP:  Results from last 7 days  Lab Units 03/04/24 1121  SODIUM mmol/L 125*  POTASSIUM mmol/L 4.9*  CHLORIDE mmol/L 90*  CO2 mmol/L 27  BUN mg/dL 22  CREATININE mg/dL 9.14  CALCIUM mg/dL 8.8  BILIRUBIN TOTAL mg/dL 0.5  AST U/L 28  ALT U/L 14  TOTAL PROTEIN g/dL 6.7  ALBUMIN g/dL 3.8  ANION GAP mmol/L 8   Coags:     BNP:   Results from last 7 days  Lab Units 03/04/24 1121  BNP pg/mL 43   Troponin:     ASSESSMENT/PLAN: Juan Combs is a 88 y.o. 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  # Radiculopathy, POA, active # Hyponatremia, POA, active # Initial encounter for falls, POA, active # Neuropathy, POA, active # Lower extremity weakness, POA, active #Nutritional deficit, POA, active -On physical exam patient has decree sensation on bilateral lower extremities however right worse than left, 1 strength patient's right lower extremity has strength against gravity however no strength against any resistance.  Patient  denies dizziness however he feels like his legs feel like jelly.  Patient has decreased oral intake secondary to fibrosis from radiation treatment. -Son endorses poor diet including muffins and a periodic soft boiled egg secondary to his aspiration/choking following radiation treatments for squamous cell carcinoma of the neck. -- PLAN: - MRI spine with and without contrast - Gabapentin 50 mg/ML oral solution 400 mg - Continue OxyContin 50 mg 12-hour extended release tablet - Acetaminophen  650 mg every 6 hours as needed  # Sacral wound, POA, active - Patient follows with outpatient wound care for sacral wound -- PLAN: -Consult to wound care, appreciate recommendations  # Urinary retention, POA, active - Patient had greater than 600 mL on bladder scan -- PLAN: - Hold mirabegron - Continue Flomax - Postvoid bladder scan - In-N-Out catheter as needed  Chronic Medical Problems: Stable at this time Chronic cancer pain secondary to metastatic disease-OxyContin 50 mg 12-hour release, oxycodone 10 mg as needed Q6 Hyperlipidemia-atorvastatin 20 mg Hypertension enalapril 20  mg GERD omeprazole  20 mg Constipation-GlycoLax  17 g daily, Peri-Colace 8.6-50 mg tablet Essential tremor-primidone  125 mg Anxiety/sleep-Xanax  0.5 mg tablet at night Nausea-Compazine 10 mg  Hospital Checklist #DVT PPX: Lovenox SubQ  #FEN: Adult Diet- Regular #Discharge Planning: Will maybe need SNF/rehab  #Ethics: DNAR / Limited SOTO    Electronically signed by Arthea Debby Caper, MD 03/04/2024 4:32 PM       [1] Past Medical History: Diagnosis Date  . Arthritis   . Benign essential tremor 02/25/2014  . Bilateral foot-drop 07/28/2014  . Cervical spondylosis with myelopathy 01/08/2018  . Cervical spondylosis with radiculopathy 01/08/2018  . Combined forms of age-related cataract of both eyes 02/19/2019  . Gait disorder 03/12/2014  . H/O prostate cancer 2013  . High cholesterol   . Hydrocele, bilateral 04/03/2016  . Hypertension   . Lumbar radiculopathy 03/05/2014  . Lung cancer    (CMD) 12 24  . Peripheral neuropathy 03/05/2014  . Prostate cancer    (CMD) 2014  . Prostate enlargement 01/14/2018   with overactive bladder  . Shoulder pain   . Stenosis of cervical spine with myelopathy    (CMD) 12/26/2017  . Wears hearing aid in both ears   . Worsening of neck pain following surgery 01/06/2018  [2] Family History Problem Relation Name Age of Onset  . Heart disease Mother    . Cataracts Mother    . Diabetes Father Prentice   . Hypertension Sister Prentice   . Hypertension Sister Heron        Deceased  . Cancer Neg Hx    . Glaucoma Neg Hx    . Macular degeneration Neg Hx    . Retinal detachment Neg Hx    . Psoriasis Neg Hx    . Eczema Neg Hx

## 2024-03-05 NOTE — Discharge Summary (Signed)
 ------------------------------------------------------------------------------- Attestation signed by Martine Lenell Andreas, MD at 03/10/2024  8:07 PM Patient very high risk for readmission given his respiratory status and ongoing aspiration, patient and family aware of his poor prognosis, seen by oncology during this admission plans to stop all treatment for his squamous cell carcinoma.  If patient does not improve the plan to pursue hospice.  Patient to be discharged on Foley catheter given urinary retention. I have interviewed and examined the patient.  I have reviewed the chart including labs and imaging  and have supervised and helped formulate the discharge plan with Sharyne Nida MD,  (Resident). I agree with the findings and plan documented.   Total time spent in discharge process was 45 minutes Martine Andreas  -------------------------------------------------------------------------------  [facility] 2 Discharge Summary  Name: Juan Combs MRN: [id number] Age: 88 yrs DOB: 1936/03/13  Admission: 03/04/2024 Admitting Physician: Martine Lenell Andreas, MD  Discharge: 03/10/24  Discharge Physician: Martine Lenell Andreas*   Admission Diagnoses:  Hyponatremia [E87.1] Peripheral edema [R60.0] Failure to thrive in adult [R62.7] Macrocytic anemia [D53.9] Generalized weakness [R53.1] Acute dyspnea [R06.00] Metastatic malignant neoplasm, unspecified site [C79.9]   Discharge Diagnoses:  As above.  Admission Condition: poor Discharged Condition: poor  Indication for Hospitalization and Brief Hospital Course: For full details, please see H&P, progress notes, consult notes and ancillary notes. Briefly, Juan Combs is a 88 y.o. 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 He presented on 03/04/2024 with a chief complaint of fall.  He was treated for this complaint. The details of his hospital stay are summarized below in a problem based format.   Radiculopathy and spinal stenosis Neuropathy Lower extremity weakness leading to ground-level fall Patient presents with acute weakness in both of his legs with lack of sensation, right worse than left.  This is causing him to fall.  He has poor nutritional health eating only eggs and muffins.  Follows with radiation oncology with plans for palliative RT soon.  He has significant lower extremity edema.  Recent MRI on July 2025 showed spinal stenosis L3-L4, L4-L5 and severe facet arthropathy L5-S1 and a 2 cm right sacral lesion suspicious for bone metastasis.  Hyponatremia Patient with chronic hyponatremia in the low 130s.  Presenting with a sodium 125 likely in the setting of hypervolemic hyponatremia given extreme lower extremity swelling.  No additional workup was done outpatient including an echocardiogram.  He was given IV Lasix with improvement in sodium from 125-128.  At time of discharge sodium had improved to 131.  Urinary retention Patient had greater than 600 mL on bladder scan it receiving INO catheter after this.  Repeat bladder scan continues to retain greater than 800, therefore urinary catheter Foley catheter was placed.  Flomax and mirabegron outpatient.  Mirabegron was held.  Urinary retention may be chronic issues and/or due to spinal pathology as described above.  He will be discharged with Foley.  Voiding trial can be attempted at nursing facility, however for comfort may be best to continue Foley.  Acute hypoxic respiratory failure Sepsis secondary to aspiration pneumonia, not present on admission On 10/30, patient had rapid response called due to hypoxia and increased work of breathing.  We suspect that he had aspiration event.  He was started on ceftriaxone and azithromycin to  cover for aspiration pneumonia and was given IV  Lasix for diuresis to help with pulmonary edema.  He required high flow nasal cannula and was transferred to [facility] for increased oxygen requirement.  He had sepsis with end organ damage as evidenced by acute hypoxic respiratory failure. Chest x-ray showed worsening pulmonary edema compared to admission. We obtained CT chest on 11/1 which showed bilateral new pleural effusion and adjacent atelectasis in both lower lobes with bilateral new diffuse interlobar septal thickening and patchy ground glass opacities representing interstitial and alveolar pulmonary edema.  CT also showed findings of pulmonary hypertension.  At time of discharge, his oxygen requirement had significantly improved to 3-4 L nasal cannula which patient will continue at nursing facility.  He will be discharged on Lasix 20 mg daily. He was treated with ceftriaxone and azithromycin for aspiration pneumonia and will complete total 7 day course with Augmentin on discharge.   The patient's chronic medical conditions were treated accordingly per the patient's home medication regimen.  The patient's medication reconciliation (with changes made to chronic medications), follow-up appointments, discharge orders, instructions and significant lab and diagnostic studies are noted below.   On the morning of 03/10/2024, the patient was deemed medically ready for discharge from the hospital. The hospital course and treatment plan going forward were discussed with the patient and his family, and all questions were answered.  The patient will be discharged to SNF and was instructed to follow up as below.  He was hemodynamically stable and in no acute distress at the time of discharge.   Discharge Follow-up Action Items: Follow up visits: PCP in 1-2 weeks Please follow up on BMP for sodium Please ensure subspecialty follow up as indicated. SNF instructions Check BMP in 2-3 days to assess sodium  Sacral wound  care: Cleanse with no rinse cleanser. Pat dry, keep dry. Cleanse left buttock with VASHE wound cleanser. Mepilex border to sacrum to include left buttock and bilateral heels to provide some pressure redistribution, to protect from shear and friction, and to promote healing, change Monday, Wednesday, Friday, and prn soiling. Waffle cushion for pressure redistribution. Z-flex boots for pressure redistribution. Encourage and assist pt with turning with frequency determined by individual needs, skin tolerance, and support surface used, emphasizing 30- degree lateral positioning.  Continue oxygen at 3-4L [place] Consider engaging hospice pending patient and family goals of care Consider increasing dose of Lasix to 40mg  daily if needed for lower extremity edema/respiratory status Medication changes: Take Augmentin for three doses: once on evening of 11/3 and twice daily on 11/4  Take Lasix 20mg  daily   Disposition: SNF  Patient was seen and examined by the attending physician on day of discharge, and was deemed appropriate for discharge.    Patient's Ordered Code Status: DNAR / Limited SOTO  Consults: IP CONSULT TO HOSPITALIST WOUND OSTOMY EVAL AND TREAT IP CONSULT TO NEUROLOGY  Discharge Orders     CNA Home Health Coordination     Details:    CNA Actions: Resume Home Health   Occupational Therapy Home Health Coordination     Details:    Actions: Resume Home Health   Physical Therapy Home Health Coordination     Details:    Actions: Resume Home Health       Patient's Ordered Code Status: DNAR / Limited SOTO  Consults: IP CONSULT TO HOSPITALIST WOUND OSTOMY EVAL AND TREAT IP CONSULT TO NEUROLOGY  CBD:  Results from last 7 days  Lab Units 03/10/24 0149 03/09/24 0128 03/08/24 0213 03/05/24 0227 03/04/24 1121  WHITE BLOOD  CELL COUNT 10*3/uL 7.47 7.64 8.40   < > 6.20  RED BLOOD CELL COUNT 10*6/uL 3.09* 3.01* 2.82*   < > 3.23*  HEMOGLOBIN g/dL 89.6* 9.9* 9.6*   < > 89.0*   HEMATOCRIT % 29.7* 29.1* 27.0*   < > 31.5*  MEAN CORPUSCULAR VOLUME fL 96.3* 96.5* 95.8   < > 97.5*  MEAN CORPUSCULAR HEMOGLOBIN pg 33.2 32.9 34.0*   < > 33.9*  MEAN CORPUSCULAR HEMOGLOBIN CONC g/dL 65.4 65.8 64.4   < > 65.2  RED CELL DISTRIBUTION WIDTH % 15.0 15.0 15.2   < > 15.9  MEAN PLATELET VOLUME fL 8.4 8.7 8.7   < > 8.0  PLATELET COUNT 10*3/uL 172 177 152   < > 177  LYMPHOCYTES RELATIVE PERCENT %  --   --   --   --  1  NEUTROPHILS RELATIVE PERCENT %  --   --   --   --  80  MONOCYTES RELATIVE PERCENT %  --   --   --   --  15  EOSINOPHILS RELATIVE PERCENT %  --   --   --   --  3  BASOPHILS RELATIVE PERCENT %  --   --   --   --  0  NEUTROPHILS ABSOLUTE COUNT 10*3/uL  --   --   --   --  5.00  LYMPHOCYTES ABSOLUTE COUNT 10*3/uL  --   --   --   --  0.10*  MONOCYTES ABSOLUTE COUNT 10*3/uL  --   --   --   --  0.90*  EOSINOPHILS ABSOLUTE COUNT 10*3/uL  --   --   --   --  0.20  BASOPHILS ABSOLUTE COUNT 10*3/uL  --   --   --   --  0.00   < > = values in this interval not displayed.   CMP:  Results from last 7 days  Lab Units 03/10/24 0149 03/09/24 0128 03/08/24 9786 03/07/24 0207 03/05/24 0227 03/04/24 1121  SODIUM mmol/L 131* 129* 128* 127*   < > 125*  POTASSIUM mmol/L 4.3 4.1 4.4 4.5   < > 4.9*  CHLORIDE mmol/L 92* 90* 90* 90*   < > 90*  CO2 mmol/L 32* 31 29 29    < > 27  BUN mg/dL 25 25 22 21    < > 22  CREATININE mg/dL 9.46* 9.44* 9.42* 9.34*   < > 0.85  CALCIUM mg/dL 8.5* 8.6 8.3* 8.8   < > 8.8  MAGNESIUM  mg/dL 2.0 1.9 2.0 1.8*   < >  --   PHOSPHORUS mg/dL 3.7 3.8 3.2 3.5   < >  --   BILIRUBIN TOTAL mg/dL  --   --   --   --   --  0.5  AST U/L  --   --   --   --   --  28  ALT U/L  --   --   --   --   --  14  TOTAL PROTEIN g/dL  --   --   --   --   --  6.7  ALBUMIN g/dL  --   --   --   --   --  3.8  ANION GAP mmol/L 7 8 9 8    < > 8   < > = values in this interval not displayed.    Disposition: SNF  Patient Instructions:    Discharge Medications     PAUSE taking  these medications  Sig Disp Refill Start End  Myrbetriq 50 mg Tb24 Wait to take this until your doctor or other care provider tells you to start again. Generic drug: mirabegron  Take 50 mg by mouth every evening.   0     * OxyCONTIN 20 mg 12 hr extended release tablet Wait to take this until your doctor or other care provider tells you to start again. Generic drug: oxyCODONE  Take 1 tablet (20 mg total) by mouth every 12 (twelve) hours. You also have another medication with the same name that you may need to continue taking.  14 tablet  0        * * There are duplicate medications prescribed to the patient          New Medications      Sig Disp Refill Start End  amoxicillin-pot clavulanate 875-125 mg per tablet Commonly known as: AUGMENTIN  Take 1 tablet by mouth 2 (two) times a day for 1 day.   0     furosemide 20 mg tablet Commonly known as: LASIX  Take 1 tablet (20 mg total) by mouth in the morning.   0     guaiFENesin 100 mg/5 mL syrup Commonly known as: ROBITUSSIN  Take 10 mL (200 mg total) by mouth every 4 (four) hours as needed for congestion.   0         Modified Medications      Sig Disp Refill Start End  naloxone 4 mg/actuation [place] nasal spray Commonly known as: NARCAN What changed:  how much to take how to take this when to take this reasons to take this  Spray the contents of 1 device into 1 nostril. Call 911. May repeat with 2nd device in alternate nostril if no response in 2-3 minutes.  To be used for accidental overdose.  2 each  1     * oxyCODONE 10 mg Tab Commonly known as: ROXICODONE What changed: Another medication with the same name was paused. Ask your nurse or doctor if you should take this medication.  Take 1 tablet (10 mg total) by mouth every 6 (six) hours as needed for moderate pain (4-6) or severe pain (7-10). Cancer related pain.  40 tablet  0     * OxyCONTIN 15 mg 12 hr extended release tablet Generic drug:  oxyCODONE What changed: Another medication with the same name was paused. Ask your nurse or doctor if you should take this medication.  Take 15 mg by mouth every 12 (twelve) hours.   0        * * There are duplicate medications prescribed to the patient          Medications To Continue      Sig Disp Refill Start End  ALPRAZolam  0.5 mg tablet Commonly known as: XANAX   Take 1 tablet (0.5 mg total) by mouth nightly as needed for sleep.  30 tablet  0     aspirin 81 mg EC tablet  Take 81 mg by mouth every morning. Pt has held for surgery 04/12/23   0     azelaic acid 15 % Gel  Apply 1 Application topically 2 (two) times a day Indications: a skin condition on the cheeks and nose with a reddish rash and acne called acne rosacea.   0     cholecalciferol 25 mcg (1,000 unit) Cap  Take 1,000 Units by mouth every morning.   0     clotrimazole-betamethasone 1-0.05 % cream Commonly known  as: LOTRISONE  Apply 1 Application topically 2 (two) times a day. Apply to affected area  45 g  3     cyanocobalamin 1,000 mcg tablet Commonly known as: VITAMIN B12  Take 1,000 mcg by mouth every evening.   0     enalapril 20 mg tablet Commonly known as: VASOTEC  Take 20 mg by mouth every morning.   0     fluticasone propionate 50 mcg/spray nasal spray Commonly known as: FLONASE  Administer 2 sprays into each nostril nightly.   0     gabapentin 250 mg/5 mL (5 mL) Soln  Take 400 mg by mouth 3 (three) times a day.   0     meloxicam 15 mg tablet Commonly known as: MOBIC  Take 7.5 mg by mouth daily.   0     mometasone 0.1 % cream Commonly known as: ELOCON  APPLY TOPICALLY TO THE AFFECTED AREA TWICE DAILY AS DIRECTED. DO NOT APPLY WITHIN 4 HOURS BEFORE RADIATION TREATMENT  45 g  2     omeprazole  20 mg DR capsule Commonly known as: PriLOSEC  Take 20 mg by mouth Once Daily for 90 days.  30 capsule  2     ondansetron 8 mg disintegrating tablet Commonly known as: ZOFRAN-ODT   DISSOLVE 1 TABLET(8 MG) ON THE TONGUE EVERY 8 HOURS AS NEEDED FOR NAUSEA OR VOMITING  20 tablet  0     pilocarpine 5 mg tablet Commonly known as: SALAGEN  Take 1 tablet (5 mg total) by mouth 3 (three) times a day.  270 tablet  3     polyethylene glycol 17 gram packet Commonly known as: GLYCOLAX   Take 17 g by mouth daily as needed.  100 each  1     * primidone  250 mg tablet Commonly known as: MYSOLINE   Take 250 mg by mouth every evening.   0     * primidone  250 mg tablet Commonly known as: MYSOLINE   Take 125 mg by mouth every morning Indications: essential tremor.   0     sennosides-docusate sodium 8.6-50 mg per tablet Commonly known as: PERICOLACE  Take 1-2 tablets by mouth every evening.   0     simvastatin 20 mg tablet Commonly known as: ZOCOR  Take 20 mg by mouth nightly.   0     tamsulosin 0.4 mg Cap Commonly known as: FLOMAX  Take 0.4 mg by mouth every evening.   0     triamcinolone acetonide 0.1 % paste Commonly known as: KENALOG  Apply to teeth as needed in the morning and as needed in the evening for mucositis.   0     TYLENOL  PM EXTRA STRENGTH ORAL  Take 2 tablets by mouth at bedtime.   0     Xyzal 5 mg tablet Generic drug: levocetirizine  Take 5 mg by mouth 2 (two) times a day.   0        * * There are duplicate medications prescribed to the patient          Unreviewed Medications      Sig Disp Refill Start End  budesonide 0.5 mg/2 mL nebulizer solution Commonly known as: PULMICORT  Add 2ml to 8 ounces of sinus rinse salt water solution and irrigate nose every morning (or night) for 30 days.  60 mL  3     prochlorperazine 10 mg tablet Commonly known as: COMPAZINE  TAKE 1 TABLET(10 MG) BY MOUTH EVERY 6 HOURS AS NEEDED FOR  NAUSEA OR VOMITING  30 tablet  2     [facility] magic mouthwash-lidocaine  2% 1:1  Swish and swallow 30 mL 4 (four) times a day as needed (throat pain).  240 mL  2          Discharge Orders     CNA Home  Health Coordination     Details:    CNA Actions: Resume Home Health   Occupational Therapy Home Health Coordination     Details:    Actions: Resume Home Health   Physical Therapy Home Health Coordination     Details:    Actions: Resume Home Health       Follow-up  Future Appointments  Date Time Provider Department Center  03/11/2024 10:30 AM Fonda Charlie Morita, MD [facility] WOUND [facility] [address]  03/22/2024  9:30 AM [facility] HEM ONC [facility] PERIPHERAL LAB [facility]ONC [facility] [place]  03/31/2024 10:00 AM [place] [facility] PATIENT EDUCATION [facility] INFUS [facility] [place]  03/09/2024 11:30 AM [place] [facility] CHAIR 15 [facility] INFUS [facility] [place]  03/20/2024  9:30 AM [facility] HEM ONC HP PERIPHERAL LAB [facility] HEMONC [facility] [place]  03/20/2024 10:00 AM Selinda Vicenta Albino, MD [facility] HEMONC [facility] [place]  06/03/2024 10:30 AM Lamar Tamar Burnie PONCE, MD [facility] ENT QK None     To request medical records from this hospitalization, please refer to [email or web address] or call [phone].  Electronically signed by: Sharyne Nida, MD 03/10/2024    *Some images could not be shown.

## 2024-03-07 NOTE — Progress Notes (Signed)
 I stopped by to see Juan Combs today.  His sons were present.  He had questions about his cancer as well as possibility of future treatment.   At this time, his performance status is a 4 which would preclude any meaningful benefit from palliative systemic treatment.  We had planned cetuximab which is an EGFR monoclonal antibody.  At the time we made that decision, his performance status was borderline 3.  At the time I felt that his performance status was going to improve the further we moved away from cytotoxic chemotherapy.  His performance status has declined the last 3 months, most likely related to chemotherapy.  I felt at that time his compromised performance status was reversible.  This hospitalization, I believe, is mostly related to his chronic aspiration which may be worsening.  I think this probably contributed to his hyponatremia and has now led to hypoxia and oxygen dependence.  This has led to significant loss in physical strength.  It is unclear at this point if he is going to regain the strength to even be able to care for himself.  Currently his cancer is not the problem.  It will eventually become the problem but I explained to he and his family that there is nothing we can do about the cancer right now.  He asked whether he would be eligible for palliative care.  I told him that I would support palliative care and recommend that he consider hospice.  I told him that in my opinion, any further treatment for his cancer is likely to cause harm rather than benefit.  I told him that treating his cancer, even with cetuximab, could result in premature death.  At this time he would not be eligible for treatment and he understands that.  If for some reason he were able to improve his performance status to ECOG 2, meaning that he is up and about more than 50% of the day, then we could consider reinitiating palliative treatment.  He understands the cetuximab would not have been a cure and our goal was  at minimum to control his disease and delay disease progression.  Quality of life, though, is of utmost importance.  He told me that his wife is having a very hard time with this and wants him to continue treatment.  She fell and broke her pelvis and is currently in a skilled nursing facility.  [name] stated that they are trying to get her moved to a semiprivate room so that [name] could be transferred there in the same semiprivate room.  I would support that plan.  I would recommend that we consult palliative care and make sure that he has palliative care or hospice support once he is discharged.  I explained to him that with his aspiration, if he continues to eat and drink, he is putting himself at risk for continued respiratory decline.  He has been seen by speech therapy and understands risk.  I support his request and desire to continue eating.  Questions were answered today to the best of my ability.  I did my best offer support.  I remain available if any questions arise.

## 2024-03-25 ENCOUNTER — Ambulatory Visit: Payer: Medicare Other | Admitting: Neurology

## 2024-03-25 NOTE — Progress Notes (Deleted)
 Patient: Juan Combs Date of Birth: 12-21-1935  Reason for Visit: Follow up History from: Patient Primary Neurologist: Juan Combs  ASSESSMENT AND PLAN 88 y.o. year old male   1.  Peripheral neuropathy with bilateral foot drop  2.  Essential tremor 3.  Gait disorder   -Again we will try to reduce primidone  from 250 mg twice daily down to 125 mg AM/250 mg PM to see if walking any better, steadier -Check primidone  level today for baseline -Continue to be careful not to fall -Follow-up with me in 1 year or sooner if needed  HISTORY OF PRESENT ILLNESS: Today 03/25/24 03/25/24 SS:   03/15/23 SS: Has been told his throat cancer is back, has lung cancer, is going to meet with thoracic surgeon coming up. Plan to try and control. Going to do immunotherapy. Neuropathy is some worse, feels weaker, without braces feels unsteady, right foot drops more, using quad cane or rollator. Few falls, no injury other than ego. We reduced primidone , but he kept taking 250 mg BID for essential tremor. Can sometimes be hard to swallow tablets. Has noticed increase in his tremor. Still doing some engineer, technical sales at [employer]. He drives. Going to [place]  for 3 months  07/19/22 SS: Here today to discuss some concerns: more right foot drop. Still works out 3 days a week with a psychologist, educational, he can do nearly all the exercises for leg strength. He has poor balance. He has more numbness to his legs. Had 1 fall, was random event. More swelling to right leg at the end of the days, compression stockings were recommended. Is not diabetic. Has chronic numbness to hands. Sound as tho symptoms are gradually progressing. His tremor was not apparent today. Remains on primidone  250 mg BID. Still has tremor with eating is bothersome. His wife has cancer, has a good prognosis. He is active, makes strong effort to do his exercises, stretching, squats.   EMG with Dr. Jenel July 2019. Nerve conduction studies done on  both upper extremities and on the right lower extremity shows evidence of a severe, end-stage primarily axonal peripheral neuropathy. There appears to be diffuse neuropathic changes in both hands, the subjective numbness reported by the patient may likely be related to the neuropathy, not associated with an isolated carpal tunnel syndrome. EMG evaluation of the right lower extremity shows distal chronic and acute denervation consistent with a peripheral neuropathy, there is no clear evidence of an overlying lumbosacral radiculopathy.   02/28/22 SS: [name] is here today for follow-up. His throat cancer is in remission. Still having trouble swallowing, often chokes. He takes gabapentin 300 mg TID, for back pain. Neuropathy is some better. [facility] has systems analyst. He rides stationary bike the rest of the days for about 25 minutes. Using a cane, no falls. Neuropathy isn't painful. Wears bilateral AFO. He drives, he has a motor home. Tremor is in hands, on primidone  300 mg BID, going to drop back to 250 mg BID, tired of taking the small 50 mg, also gait instability. His writing is impaired. He works part time, he does clinical biochemist.   HISTORY 02/28/2021 Dr Juan Combs: [name] is an 88 year old right-handed white male with a history of an essential tremor and a relatively severe peripheral neuropathy associated with a gait disorder.  The patient uses bilateral AFO braces and uses a cane for ambulation, he has fallen on occasion.  He feels that the right leg is weaker than  the left.  Unfortunately in the last several months he was diagnosed with throat cancer and has received radiation therapy but he now has a nonhealing ulcer at the base of the tongue on the left.  He has a lot of pain associated with this.  He has very little pain with his peripheral neuropathy.  He has cut back on the gabapentin taking 300 mg twice daily in part because he was having difficulty swallowing  the medication.  The patient had a fall within the last month while taking oxycodone, this resulted in increased gait instability, he has done better when switching back to the hydrocodone .  He may use a walker inside the house.  He returns for an evaluation.  He has lost weight because of the throat cancer.   REVIEW OF SYSTEMS: Out of a complete 14 system review of symptoms, the patient complains only of the following symptoms, and all other reviewed systems are negative.  See HPI  ALLERGIES: No Known Allergies  HOME MEDICATIONS: Outpatient Medications Prior to Visit  Medication Sig Dispense Refill   acetaminophen  (TYLENOL ) 325 MG tablet Take 650 mg by mouth every 6 (six) hours as needed. (Patient not taking: Reported on 03/15/2023)     ALPRAZolam  (XANAX ) 0.5 MG tablet Take 0.5 mg by mouth at bedtime as needed.     Ascorbic Acid (VITAMIN C) 1000 MG tablet Take 1,000 mg by mouth daily.     aspirin 81 MG tablet Take 81 mg by mouth daily.     Azelaic Acid 15 % gel Apply topically 2 (two) times daily. After skin is thoroughly washed and patted dry, gently but thoroughly massage a thin film of azelaic acid cream into the affected area twice daily, in the morning and evening.     Cholecalciferol (VITAMIN D3 PO) Take 1,000 Units by mouth daily.      Cyanocobalamin (VITAMIN B 12 PO) Take 1,000 mg by mouth.     enalapril (VASOTEC) 20 MG tablet Take 20 mg by mouth daily.     fluticasone (FLONASE) 50 MCG/ACT nasal spray Place into both nostrils daily.     gabapentin (NEURONTIN) 250 MG/5ML solution Take 400 mg by mouth 3 (three) times daily.     gabapentin (NEURONTIN) 400 MG capsule Take by mouth. (Patient not taking: Reported on 03/15/2023)     magic mouthwash SOLN SWISH AND SWALLOW 30 MLS BY MOUTH THREE TIMES DAILY AS NEEDED (Patient not taking: Reported on 03/15/2023)     meloxicam (MOBIC) 15 MG tablet Taking 1 every other day     mirabegron ER (MYRBETRIQ) 50 MG TB24 tablet Take 50 mg by mouth daily.      mometasone (ELOCON) 0.1 % cream Apply 1 application topically 2 (two) times daily.     omeprazole  (PRILOSEC) 20 MG capsule Take 1 capsule (20 mg total) by mouth daily. 30 capsule 0   polyethylene glycol (MIRALAX  / GLYCOLAX ) packet Take 17 g by mouth daily as needed.     primidone  (MYSOLINE ) 250 MG tablet Take 1 tablet twice daily 180 tablet 3   senna-docusate (SENOKOT-S) 8.6-50 MG tablet Take 2 tablets by mouth 2 (two) times daily.     simvastatin (ZOCOR) 20 MG tablet Take 20 mg by mouth daily.     tamsulosin (FLOMAX) 0.4 MG CAPS capsule Take 0.4 mg by mouth.     traMADol (ULTRAM) 50 MG tablet      traMADol (ULTRAM-ER) 200 MG 24 hr tablet Take 200 mg by mouth daily.  No facility-administered medications prior to visit.    PAST MEDICAL HISTORY: Past Medical History:  Diagnosis Date   Foot drop, bilateral 07/28/2014   Gait disorder 03/12/2014   Hereditary and idiopathic peripheral neuropathy 03/05/2014   Hyperlipidemia    Hypertension    Lumbar radiculopathy 03/05/2014   Neck pain    Peripheral neuropathy    bilateral feet   Prostate cancer (HCC)    currently has seed implant   Prostate enlargement    Spinal stenosis    Tremor    Urinary retention     PAST SURGICAL HISTORY: Past Surgical History:  Procedure Laterality Date   CERVICAL SPINE SURGERY     KNEE ARTHROSCOPY     NASAL SINUS SURGERY     VASECTOMY     VASOTOMY      FAMILY HISTORY: Family History  Problem Relation Age of Onset   Emphysema Father    Scoliosis Sister     SOCIAL HISTORY: Social History   Socioeconomic History   Marital status: Married    Spouse name: Not on file   Number of children: Not on file   Years of education: Not on file   Highest education level: Not on file  Occupational History   Not on file  Tobacco Use   Smoking status: Never   Smokeless tobacco: Never  Vaping Use   Vaping status: Never Used  Substance and Sexual Activity   Alcohol use: Not Currently    Comment:  occassionally   Drug use: No   Sexual activity: Not on file  Other Topics Concern   Not on file  Social History Narrative   Patient is right handed.   Patient drinks very little caffeine.   Social Drivers of Corporate Investment Banker Strain: Low Risk  (09/20/2023)   Received from [facility]   Overall Financial Resource Strain (CARDIA)    Difficulty of Paying Living Expenses: Not hard at all  Food Insecurity: Low Risk  (03/05/2024)   Received from [facility]   Hunger Vital Sign    Within the past 12 months, you worried that your food would run out before you got [place] to buy more: Never true    Within the past 12 months, the food you bought just didn't last and you didn't have [place] to get more. : Never true  Transportation Needs: No Transportation Needs (03/05/2024)   Received from [facility]    In the past 12 months, has lack of reliable transportation kept you from medical appointments, meetings, work or from getting things needed for daily living? : No  Physical Activity: Not on file  Stress: Not on file  Social Connections: Not on file  Intimate Partner Violence: Not on file   PHYSICAL EXAM  There were no vitals filed for this visit.   There is no height or weight on file to calculate BMI.  Generalized: Well developed, in no acute distress  Neurological examination  Mentation: Alert oriented to time, place, history taking. Follows all commands speech and language fluent, voice is raspy Cranial nerve II-XII: Pupils were equal round reactive to light. Extraocular movements were full, visual field were full on confrontational test. Facial sensation and strength were normal. Head turning and shoulder shrug  were normal and symmetric. Motor: Good strength overall, 4/5 right hip flexion, bilateral foot drop Sensory: Length dependent sensory deficit soft touch  Coordination: Cerebellar testing reveals good finger-nose-finger and heel-to-shin  bilaterally.  Mild tremor FTN more  on left.  With handwriting sample tremor is mild to moderately transmitted Gait and station: Gait is wide-based, cautious, he walks with a cane, wearing bilateral AFO braces Reflexes: Deep tendon reflexes are symmetric but decreased  DIAGNOSTIC DATA (LABS, IMAGING, TESTING) - I reviewed patient records, labs, notes, testing and imaging myself where available.  Lab Results  Component Value Date   WBC 4.9 03/09/2018   HGB 11.5 (L) 03/09/2018   HCT 37.0 (L) 03/09/2018   MCV 96.6 03/09/2018   PLT 179 03/09/2018      Component Value Date/Time   NA 141 03/09/2018 2240   K 4.3 03/09/2018 2240   CL 107 03/09/2018 2240   CO2 26 03/09/2018 2240   GLUCOSE 97 03/09/2018 2240   BUN 23 03/09/2018 2240   CREATININE 0.88 03/09/2018 2240   CALCIUM 8.6 (L) 03/09/2018 2240   PROT 6.2 03/12/2014 0948   ALBUMIN 3.9 01/25/2013 2335   AST 20 01/25/2013 2335   ALT 18 01/25/2013 2335   ALKPHOS 44 01/25/2013 2335   BILITOT 0.5 01/25/2013 2335   GFRNONAA >60 03/09/2018 2240   GFRAA >60 03/09/2018 2240   No results found for: CHOL, HDL, LDLCALC, LDLDIRECT, TRIG, CHOLHDL No results found for: YHAJ8R No results found for: VITAMINB12 No results found for: TSH  Lauraine Born, AGNP-C, DNP 03/25/2024, 5:32 AM [facility] [address] [place], [place] [address] [phone]

## 2024-04-07 DEATH — deceased
